# Patient Record
Sex: Female | Born: 1995 | Race: White | Hispanic: No | Marital: Single | Smoking: Never smoker
Health system: Southern US, Community
[De-identification: ages and names within clinical notes are randomized; demographics above are authoritative.]

## PROBLEM LIST (undated history)

## (undated) DIAGNOSIS — Q999 Chromosomal abnormality, unspecified: Secondary | ICD-10-CM

## (undated) DIAGNOSIS — F819 Developmental disorder of scholastic skills, unspecified: Secondary | ICD-10-CM

## (undated) DIAGNOSIS — G40909 Epilepsy, unspecified, not intractable, without status epilepticus: Secondary | ICD-10-CM

## (undated) HISTORY — DX: Chromosomal abnormality, unspecified: Q99.9

---
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
## 2015-01-04 ENCOUNTER — Encounter: Payer: Self-pay | Admitting: Orthopedic Surgery

## 2015-01-04 ENCOUNTER — Ambulatory Visit (INDEPENDENT_AMBULATORY_CARE_PROVIDER_SITE_OTHER): Payer: Medicaid Other | Admitting: Orthopedic Surgery

## 2015-01-04 VITALS — BP 113/72 | Ht 63.0 in | Wt 215.0 lb

## 2015-01-04 DIAGNOSIS — S92301A Fracture of unspecified metatarsal bone(s), right foot, initial encounter for closed fracture: Secondary | ICD-10-CM

## 2015-01-04 NOTE — Progress Notes (Signed)
Patient ID: Penny Thompson, female   DOB: [birth year], 19 y.o.   MRN: [id number]  Chief Complaint  Patient presents with  . Foot Injury    ER follow up on right foot fracture, DOI 12-20-14.    HPI Penny Thompson is a 19 y.o. female.   Presents with a caregiver because she has a history of epilepsy and some other mental deficiencies. She and he gave the history as follows. Approximate 2 weeks ago the patient fell and injured her right foot and ankle. She was taken to the emergency room where x-rays of the foot and ankle show an avulsion fracture of the fifth metatarsal.  Initially she had severe. Pain along the lateral aspect foot and ankle associated with swelling. The pain was constant but has resolved over 2 weeks time it was dull aching and did initially cause a limp which has subsequently resolved after treatment with a Cam Walker   Review of systems neurologically there is no tingling vascular the foot is still warm skin ecchymosis  Review of Systems Review of Systems  Past Medical History  Diagnosis Date  . Epilepsy (HCC)   . Mental developmental delay   . Chromosomal abnormality     No past surgical history on file.  Family History  Problem Relation Age of Onset  . Diabetes Maternal Grandmother   . Diabetes Maternal Grandfather   . Diabetes Paternal Grandmother     Social History Social History  Substance Use Topics  . Smoking status: Never Smoker   . Smokeless tobacco: Not on file  . Alcohol Use: No    No Known Allergies  Current Outpatient Prescriptions  Medication Sig Dispense Refill  . carbamazepine (CARBATROL) 300 MG 12 hr capsule Take 600 mg by mouth 2 (two) times daily.    . cetirizine (ZYRTEC) 10 MG tablet Take 1 tablet (10 mg total) by mouth daily. 30 tablet 11  . Multiple Vitamin (MULTIVITAMIN WITH MINERALS) TABS tablet Take 1 tablet by mouth daily.     No current facility-administered medications for this visit.       Physical Exam Physical  Exam Blood pressure 113/72, height 5\' 3"  (1.6 m), weight 215 lb (97.523 kg). Appearance, there are no abnormalities in terms of appearance the patient was well-developed and well-nourished. The grooming and hygiene were normal.  Mental status orientation, there was normal alertness and orientation Mood pleasant Ambulatory status  No assistive devices were noted but the patient did have a slight limp Examination of the  Right foot Inspection  Tenderness over the fifth metatarsal at the proximal aspect and somewhat over the anterior talofibular ligament. 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 which is flexible but normal ankle range of motion Tests for stability  Anterior drawer test was stable Motor strength   Muscle tone was normal Skin warm dry and intact without laceration or ulceration  He most is in the toes of the fifth fourth and third digits and also in the lateral border of the foot Neurologic examination normal sensation Vascular examination normal pulses with warm extremity and normal capillary refill     Data Reviewed  we independently reviewed and red 2 sets of films 3 views right ankle  : normal appearing ankle with slight tarsometatarsal flattening and prominent head of talus as well as 3 views right foot which show  Tiny avulsion fracture from the proximal aspect of the fifth metatarsal   CLINICAL DATA:  Patient  tripped and fell this morning injuring her right foot.   EXAM: RIGHT FOOT COMPLETE - 3+ VIEW   COMPARISON:  10/06/2013   FINDINGS: There is a small sliver of bone adjacent to the base of the fifth metatarsal, new from the prior exam, consistent with an avulsion fracture at the insertion of the peroneus brevis tendon. There is mild adjacent soft tissue edema.   No other evidence of a fracture. Joints are normally spaced and aligned.   IMPRESSION: Small avulsion fracture at the insertion site of the peroneus brevis tendon  at the base of the right fifth metatarsal.     Electronically Signed   By: Amie Portland M.D.   On: 12/19/2014 16:40   CLINICAL DATA:  Larey Seat this morning. Right ankle pain and bruising. Initial encounter.   EXAM: RIGHT ANKLE - COMPLETE 3+ VIEW   COMPARISON:  None.   FINDINGS: There is no evidence of fracture, dislocation, or joint effusion. There is no evidence of arthropathy or other focal bone abnormality. Soft tissues are unremarkable.   IMPRESSION: Negative.     Electronically Signed   By: Myles Rosenthal M.D.   On: 12/19/2014 16:35  Assessment  Encounter Diagnosis  Name Primary?  Marland Kitchen Avulsion fracture of metatarsal bone of right foot, closed, initial encounter Yes      Plan   our treatment plan is for her to resume normal activities call us if any problems

## 2015-02-21 ENCOUNTER — Emergency Department (HOSPITAL_COMMUNITY)
Admission: EM | Admit: 2015-02-21 | Discharge: 2015-02-21 | Disposition: A | Discharge status: Home / Self Care | Payer: Medicaid Other | Attending: Emergency Medicine | Admitting: Emergency Medicine

## 2015-02-21 ENCOUNTER — Encounter (HOSPITAL_COMMUNITY): Payer: Self-pay | Admitting: Emergency Medicine

## 2015-02-21 DIAGNOSIS — H9201 Otalgia, right ear: Secondary | ICD-10-CM | POA: Insufficient documentation

## 2015-02-21 DIAGNOSIS — H6123 Impacted cerumen, bilateral: Secondary | ICD-10-CM | POA: Insufficient documentation

## 2015-02-21 DIAGNOSIS — G40909 Epilepsy, unspecified, not intractable, without status epilepticus: Secondary | ICD-10-CM | POA: Diagnosis not present

## 2015-02-21 DIAGNOSIS — R509 Fever, unspecified: Secondary | ICD-10-CM | POA: Insufficient documentation

## 2015-02-21 DIAGNOSIS — Z8659 Personal history of other mental and behavioral disorders: Secondary | ICD-10-CM | POA: Insufficient documentation

## 2015-02-21 DIAGNOSIS — Z79899 Other long term (current) drug therapy: Secondary | ICD-10-CM | POA: Diagnosis not present

## 2015-02-21 DIAGNOSIS — J029 Acute pharyngitis, unspecified: Secondary | ICD-10-CM | POA: Diagnosis present

## 2015-02-21 DIAGNOSIS — Q999 Chromosomal abnormality, unspecified: Secondary | ICD-10-CM | POA: Insufficient documentation

## 2015-02-21 DIAGNOSIS — R300 Dysuria: Secondary | ICD-10-CM | POA: Diagnosis not present

## 2015-02-21 LAB — URINE MICROSCOPIC-ADD ON

## 2015-02-21 LAB — URINALYSIS, ROUTINE W REFLEX MICROSCOPIC
GLUCOSE, UA: NEGATIVE mg/dL
HGB URINE DIPSTICK: NEGATIVE
KETONES UR: 15 mg/dL — AB
Nitrite: NEGATIVE
Protein, ur: 30 mg/dL — AB
SPECIFIC GRAVITY, URINE: 1.04 — AB (ref 1.005–1.030)
pH: 5.5 (ref 5.0–8.0)

## 2015-02-21 LAB — POC URINE PREG, ED: Preg Test, Ur: NEGATIVE

## 2015-02-21 LAB — RAPID STREP SCREEN (MED CTR MEBANE ONLY): Streptococcus, Group A Screen (Direct): NEGATIVE

## 2015-02-21 MED ORDER — ACETAMINOPHEN 500 MG PO TABS
1000.0000 mg | ORAL_TABLET | Freq: Once | ORAL | Status: AC
  Administered 2015-02-21: 1000 mg via ORAL
  Filled 2015-02-21: qty 2

## 2015-02-21 MED ORDER — DEXAMETHASONE 4 MG PO TABS
10.0000 mg | ORAL_TABLET | Freq: Once | ORAL | Status: AC
  Administered 2015-02-21: 10 mg via ORAL
  Filled 2015-02-21: qty 3

## 2015-02-21 NOTE — ED Notes (Addendum)
Pt c/o sore throat and fever x 3 days. Pt able to eat and drink. Pt also c/o back pain and mom thinks she may have urinary infection.

## 2015-02-21 NOTE — ED Provider Notes (Signed)
CSN: [id number]     Arrival date & time 02/21/15  1043 History   First MD Initiated Contact with Patient 02/21/15 1137     Chief Complaint  Patient presents with  . Sore Throat  . Fever     (Consider location/radiation/quality/duration/timing/severity/associated sxs/prior Treatment) Patient is a 19 y.o. female presenting with pharyngitis and fever. The history is provided by the patient.  Sore Throat This is a new problem. The current episode started more than 2 days ago. The problem occurs constantly. The problem has not changed since onset.Pertinent negatives include no chest pain, no abdominal pain, no headaches and no shortness of breath. Nothing aggravates the symptoms. Nothing relieves the symptoms. She has tried nothing for the symptoms. The treatment provided no relief.  Fever Associated symptoms: dysuria, ear pain (R ear) and sore throat   Associated symptoms: no chest pain, no chills, no congestion, no headaches, no myalgias, no nausea, no rhinorrhea and no vomiting    19 yo F  With a chief complaint of a sore throat and dysuria. This been going on for the past 3 days. Temperature highest 100 at home.  Patient has been able to eat and drink without difficulty.  Denies flank pain abdominal pain.  Has a sick sibling at home.   Past Medical History  Diagnosis Date  . Epilepsy (HCC)   . Mental developmental delay   . Chromosomal abnormality    History reviewed. No pertinent past surgical history. Family History  Problem Relation Age of Onset  . Diabetes Maternal Grandmother   . Diabetes Maternal Grandfather   . Diabetes Paternal Grandmother    Social History  Substance Use Topics  . Smoking status: Never Smoker   . Smokeless tobacco: None  . Alcohol Use: No   OB History    No data available     Review of Systems  Constitutional: Positive for fever. Negative for chills.  HENT: Positive for ear pain (R ear) and sore throat. Negative for congestion and rhinorrhea.    Eyes: Negative for redness and visual disturbance.  Respiratory: Negative for shortness of breath and wheezing.   Cardiovascular: Negative for chest pain and palpitations.  Gastrointestinal: Negative for nausea, vomiting and abdominal pain.  Genitourinary: Positive for dysuria. Negative for urgency.  Musculoskeletal: Negative for myalgias and arthralgias.  Skin: Negative for pallor and wound.  Neurological: Negative for dizziness and headaches.      Allergies  Review of patient's allergies indicates no known allergies.  Home Medications   Prior to Admission medications   Medication Sig Start Date End Date Taking? Authorizing Provider  carbamazepine (CARBATROL) 300 MG 12 hr capsule Take 600 mg by mouth 2 (two) times daily.   Yes Historical Provider, MD  cetirizine (ZYRTEC) 10 MG tablet Take 1 tablet (10 mg total) by mouth daily. 03/18/14  Yes Arnaldo Natal, MD  Multiple Vitamin (MULTIVITAMIN WITH MINERALS) TABS tablet Take 1 tablet by mouth daily.   Yes Historical Provider, MD  Salicylic Acid 6 % LOTN Apply once daily to arms and legs.As needed. 01/19/12  Yes Historical Provider, MD   BP 118/79 mmHg  Pulse 96  Temp(Src) 97.9 F (36.6 C) (Oral)  Resp 20  Ht 5' 8.5" (1.74 m)  Wt 225 lb (102.059 kg)  BMI 33.71 kg/m2  SpO2 98%  LMP 02/03/2015 Physical Exam  Constitutional: She is oriented to person, place, and time. She appears well-developed and well-nourished. No distress.  HENT:  Head: Normocephalic and atraumatic.   Tonsils are absent  right anterior cervical lymphadenopathy. Bilateral wax impaction unable to visualize TMs.  Handling secretions no uvula deviation  Eyes: EOM are normal. Pupils are equal, round, and reactive to light.  Neck: Normal range of motion. Neck supple.  Cardiovascular: Normal rate and regular rhythm.  Exam reveals no gallop and no friction rub.   No murmur heard. Pulmonary/Chest: Effort normal. She has no wheezes. She has no rales.  Abdominal: Soft.  She exhibits no distension. There is no tenderness. There is no rebound and no guarding.  Musculoskeletal: She exhibits no edema or tenderness.  Neurological: She is alert and oriented to person, place, and time.  Skin: Skin is warm and dry. She is not diaphoretic.  Psychiatric: She has a normal mood and affect. Her behavior is normal.  Nursing note and vitals reviewed.   ED Course  Procedures (including critical care time) Labs Review Labs Reviewed  URINALYSIS, ROUTINE W REFLEX MICROSCOPIC (NOT AT [facility]) - Abnormal; Notable for the following:    Color, Urine ORANGE (*)    APPearance CLOUDY (*)    Specific Gravity, Urine 1.040 (*)    Bilirubin Urine SMALL (*)    Ketones, ur 15 (*)    Protein, ur 30 (*)    Leukocytes, UA TRACE (*)    All other components within normal limits  URINE MICROSCOPIC-ADD ON - Abnormal; Notable for the following:    Squamous Epithelial / LPF 6-30 (*)    Bacteria, UA RARE (*)    All other components within normal limits  RAPID STREP SCREEN (NOT AT [facility])  CULTURE, GROUP A STREP  POC URINE PREG, ED    Imaging Review No results found. I have personally reviewed and evaluated these images and lab results as part of my medical decision-making.   EKG Interpretation None      MDM   Final diagnoses:  Viral pharyngitis  Dysuria    19 yo F  With a chief complaint of a sore throat and dysuria. Will obtain a rapid strep as well as a urine.   rapid strep negative. Urine with trace leukocytes rare bacteria  With 6-30 epis. Will not treat at this time. Patient will go home with Tylenol and Motrin PCP follow-up next week.  1:20 PM:  I have discussed the diagnosis/risks/treatment options with the patient and family and believe the pt to be eligible for discharge home to follow-up with PCP. We also discussed returning to the ED immediately if new or worsening sx occur. We discussed the sx which are most concerning (e.g., sudden worsening pain, fever, inability  to tolerate by mouth) that necessitate immediate return. Medications administered to the patient during their visit and any new prescriptions provided to the patient are listed below.  Medications given during this visit Medications  dexamethasone (DECADRON) tablet 10 mg (not administered)  acetaminophen (TYLENOL) tablet 1,000 mg (1,000 mg Oral Given 02/21/15 1232)    New Prescriptions   No medications on file    The patient appears reasonably screen and/or stabilized for discharge and I doubt any other medical condition or other [facility] requiring further screening, evaluation, or treatment in the ED at this time prior to discharge.    Melene Planan Zeta Bucy, DO 02/21/15 1321

## 2015-02-21 NOTE — ED Notes (Signed)
Pt verbalized understanding of d/c instructionsand follow-up care. No further questions/concerns, VSS, assisted to lobby in wheelchair. 

## 2015-02-21 NOTE — Discharge Instructions (Signed)
Take tylenol 2 pills 4 times a day and ibuprofen 4 pills 3 times a day.  Drink plenty of fluids.  Return for worsening shortness of breath, headache, confusion. Follow up with your family doctor.   Pharyngitis Pharyngitis is a sore throat (pharynx). There is redness, pain, and swelling of your throat. HOME CARE   Drink enough fluids to keep your pee (urine) clear or pale yellow.  Only take medicine as told by your doctor.  You may get sick again if you do not take medicine as told. Finish your medicines, even if you start to feel better.  Do not take aspirin.  Rest.  Rinse your mouth (gargle) with salt water ( tsp of salt per 1 qt of water) every 1-2 hours. This will help the pain.  If you are not at risk for choking, you can suck on hard candy or sore throat lozenges. GET HELP IF:  You have large, tender lumps on your neck.  You have a rash.  You cough up green, yellow-brown, or bloody spit. GET HELP RIGHT AWAY IF:   You have a stiff neck.  You drool or cannot swallow liquids.  You throw up (vomit) or are not able to keep medicine or liquids down.  You have very bad pain that does not go away with medicine.  You have problems breathing (not from a stuffy nose). MAKE SURE YOU:   Understand these instructions.  Will watch your condition.  Will get help right away if you are not doing well or get worse.   This information is not intended to replace advice given to you by your health care provider. Make sure you discuss any questions you have with your health care provider.   Document Released: 08/09/2007 Document Revised: 12/11/2012 Document Reviewed: 10/28/2012 Elsevier Interactive Patient Education [employer]2016 Elsevier Inc.

## 2015-02-22 ENCOUNTER — Encounter: Payer: Self-pay | Admitting: Pediatrics

## 2015-02-22 ENCOUNTER — Ambulatory Visit (INDEPENDENT_AMBULATORY_CARE_PROVIDER_SITE_OTHER): Payer: Medicaid Other | Admitting: Pediatrics

## 2015-02-22 VITALS — Temp 97.8°F | Wt 211.5 lb

## 2015-02-22 DIAGNOSIS — J069 Acute upper respiratory infection, unspecified: Secondary | ICD-10-CM

## 2015-02-22 DIAGNOSIS — Q999 Chromosomal abnormality, unspecified: Secondary | ICD-10-CM

## 2015-02-22 MED ORDER — FLUTICASONE PROPIONATE 50 MCG/ACT NA SUSP: 2.0000 | Freq: Two times a day (BID) | NASAL | Status: DC

## 2015-02-22 NOTE — Progress Notes (Signed)
Chr 19 Delsym Palate Chief Complaint  Patient presents with  . Hospitalization Follow-up    E.R. visit on yesterday dx: virus /// x 4 days cough, fever, sore throat( mucus drain)    HPI Penny P Lopezis here for cough for 3 days. Dad believes she had fever, does not know her temperature.  History was provided by the father. .  ROS:.        Constitutional  Fever?, decreasedl activity.   Opthalmologic  no irritation or drainage.   ENT  Has  rhinorrhea and congestion , no sore throat, no ear pain.   Respiratory  Has  cough ,  No wheeze or chest pain.    Cardiovascular  No chest pain, repaired ASD Gastointestinal  no abdominal pain, nausea or vomiting, bowel movements normal    Genitourinary  Voiding normally   Musculoskeletal  no complaints of pain, no injuries.   Dermatologic  no rashes or lesions Neurologic - has seizure disorder     family history includes Diabetes in her maternal grandfather, maternal grandmother, and paternal grandmother.   Temp(Src) 97.8 F (36.6 C)  Wt 211 lb 8 oz (95.936 kg)  LMP 02/03/2015    Objective:         General alert in NAD- dysmorphic  Derm   no rashes or lesions  Head Normocephalic, atraumatic                    Eyes Normal, no discharge  Ears:   TMs normal bilaterally  Nose:   patent normal mucosa, turbinates normal, no rhinorhea  Oral cavity  moist mucous membranes, no lesions, marked high arched narrow palate  Throat:   normal tonsils, without exudate or erythema  Neck supple FROM  Lymph:   no significant cervical adenopathy  Lungs:  clear with equal breath sounds bilaterally  Heart:   regular rate and rhythm, no murmur  Abdomen:  soft nontender no organomegaly or masses  GU:  deferred  back No deformity  Extremities:   no deformity  Neuro:  intact no focal defects        Assessment/plan  1. Acute upper respiratory infection Cough due to post nasal drip, can try delsym  - fluticasone (FLONASE) 50 MCG/ACT nasal spray;  Place 2 sprays into both nostrils 2 (two) times daily.  Dispense: 16 g; Refill: 2  2. 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

## 2015-02-22 NOTE — Patient Instructions (Signed)
Upper Respiratory Infection, Adult Most upper respiratory infections (URIs) are a viral infection of the air passages leading to the lungs. A URI affects the nose, throat, and upper air passages. The most common type of URI is nasopharyngitis and is typically referred to as "the common cold." URIs run their course and usually go away on their own. Most of the time, a URI does not require medical attention, but sometimes a bacterial infection in the upper airways can follow a viral infection. This is called a secondary infection. Sinus and middle ear infections are common types of secondary upper respiratory infections. Bacterial pneumonia can also complicate a URI. A URI can worsen asthma and chronic obstructive pulmonary disease (COPD). Sometimes, these complications can require emergency medical care and may be life threatening.  CAUSES Almost all URIs are caused by viruses. A virus is a type of germ and can spread from one person to another.  RISKS FACTORS You may be at risk for a URI if:   You smoke.   You have chronic heart or lung disease.  You have a weakened defense (immune) system.   You are very young or very old.   You have nasal allergies or asthma.  You work in crowded or poorly ventilated areas.  You work in health care facilities or schools. SIGNS AND SYMPTOMS  Symptoms typically develop 2-3 days after you come in contact with a cold virus. Most viral URIs last 7-10 days. However, viral URIs from the influenza virus (flu virus) can last 14-18 days and are typically more severe. Symptoms may include:   Runny or stuffy (congested) nose.   Sneezing.   Cough.   Sore throat.   Headache.   Fatigue.   Fever.   Loss of appetite.   Pain in your forehead, behind your eyes, and over your cheekbones (sinus pain).  Muscle aches.  DIAGNOSIS  Your health care provider may diagnose a URI by:  Physical exam.  Tests to check that your symptoms are not due to  another condition such as:  Strep throat.  Sinusitis.  Pneumonia.  Asthma. TREATMENT  A URI goes away on its own with time. It cannot be cured with medicines, but medicines may be prescribed or recommended to relieve symptoms. Medicines may help:  Reduce your fever.  Reduce your cough.  Relieve nasal congestion. HOME CARE INSTRUCTIONS   Take medicines only as directed by your health care provider.   Gargle warm saltwater or take cough drops to comfort your throat as directed by your health care provider.  Use a warm mist humidifier or inhale steam from a shower to increase air moisture. This may make it easier to breathe.  Drink enough fluid to keep your urine clear or pale yellow.   Eat soups and other clear broths and maintain good nutrition.   Rest as needed.   Return to work when your temperature has returned to normal or as your health care provider advises. You may need to stay home longer to avoid infecting others. You can also use a face mask and careful hand washing to prevent spread of the virus.  Increase the usage of your inhaler if you have asthma.   Do not use any tobacco products, including cigarettes, chewing tobacco, or electronic cigarettes. If you need help quitting, ask your health care provider. PREVENTION  The best way to protect yourself from getting a cold is to practice good hygiene.   Avoid oral or hand contact with people with cold   symptoms.   Wash your hands often if contact occurs.  There is no clear evidence that vitamin C, vitamin E, echinacea, or exercise reduces the chance of developing a cold. However, it is always recommended to get plenty of rest, exercise, and practice good nutrition.  SEEK MEDICAL CARE IF:   You are getting worse rather than better.   Your symptoms are not controlled by medicine.   You have chills.  You have worsening shortness of breath.  You have brown or red mucus.  You have yellow or brown nasal  discharge.  You have pain in your face, especially when you bend forward.  You have a fever.  You have swollen neck glands.  You have pain while swallowing.  You have white areas in the back of your throat. SEEK IMMEDIATE MEDICAL CARE IF:   You have severe or persistent:  Headache.  Ear pain.  Sinus pain.  Chest pain.  You have chronic lung disease and any of the following:  Wheezing.  Prolonged cough.  Coughing up blood.  A change in your usual mucus.  You have a stiff neck.  You have changes in your:  Vision.  Hearing.  Thinking.  Mood. MAKE SURE YOU:   Understand these instructions.  Will watch your condition.  Will get help right away if you are not doing well or get worse.   This information is not intended to replace advice given to you by your health care provider. Make sure you discuss any questions you have with your health care provider.   Document Released: 08/16/2000 Document Revised: 07/07/2014 Document Reviewed: 05/28/2013 Elsevier Interactive Patient Education 2016 [employer]  

## 2015-02-23 LAB — CULTURE, GROUP A STREP: Strep A Culture: NEGATIVE

## 2015-04-05 ENCOUNTER — Other Ambulatory Visit: Payer: Self-pay | Admitting: Pediatrics

## 2015-04-05 DIAGNOSIS — J302 Other seasonal allergic rhinitis: Secondary | ICD-10-CM

## 2015-04-05 MED ORDER — CETIRIZINE HCL 10 MG PO TABS: 10.0000 mg | ORAL_TABLET | Freq: Every day | ORAL | Status: AC

## 2015-09-02 ENCOUNTER — Encounter: Payer: Self-pay | Admitting: Pediatrics

## 2016-03-31 DIAGNOSIS — Z029 Encounter for administrative examinations, unspecified: Secondary | ICD-10-CM

## 2016-09-27 IMAGING — DX DG FOOT COMPLETE 3+V*R*
3 series · 3 of 3 positions shown · non-contrast
Comparison: 10/06/2013

CLINICAL DATA: Patient tripped and fell this morning injuring her
right foot.

EXAM:
RIGHT FOOT COMPLETE - 3+ VIEW

[foot ap]
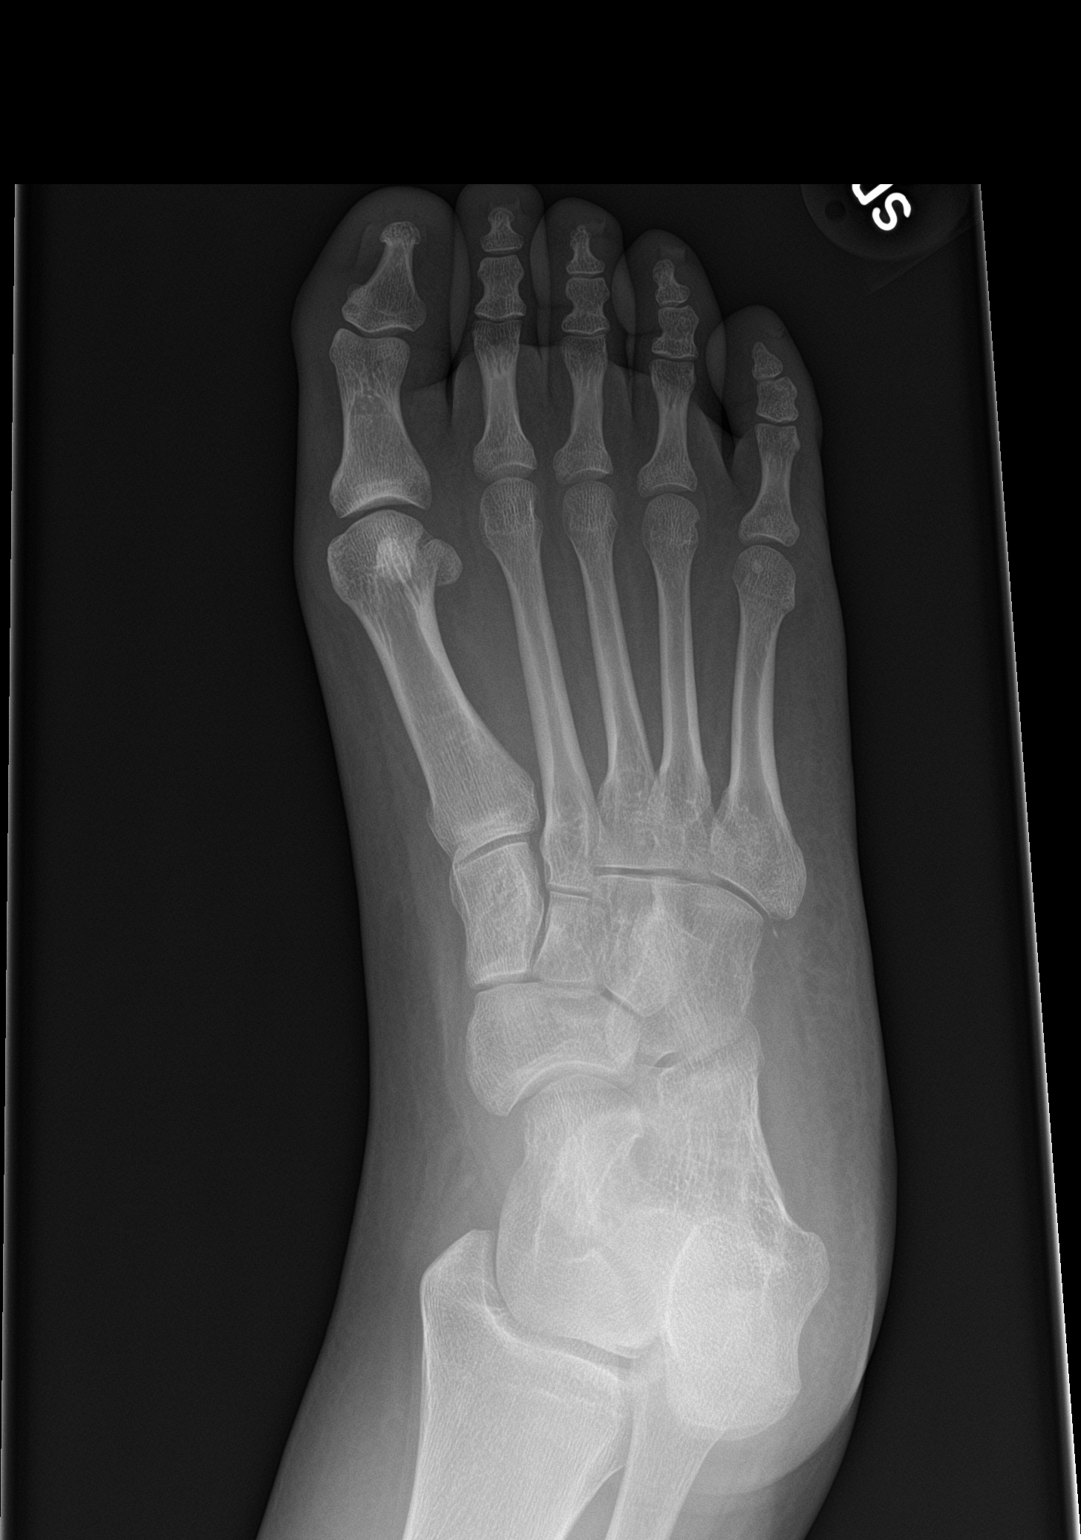

[foot obl]
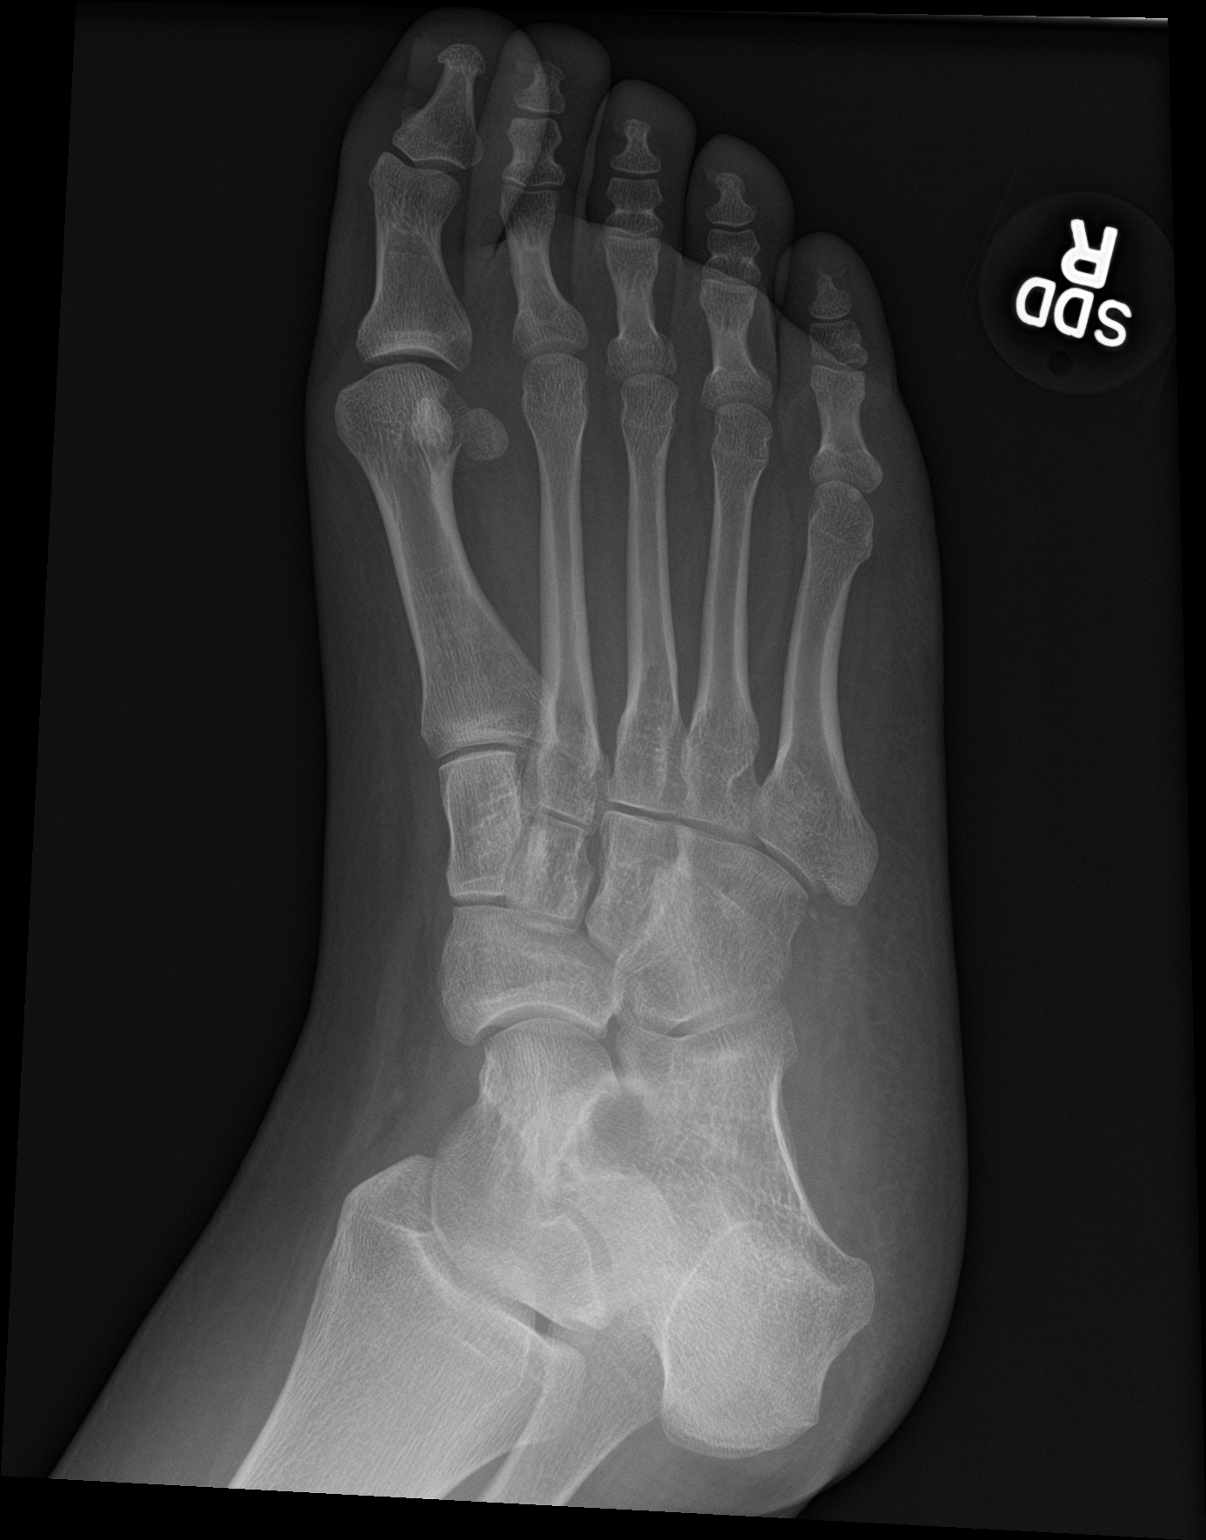

[foot lat]
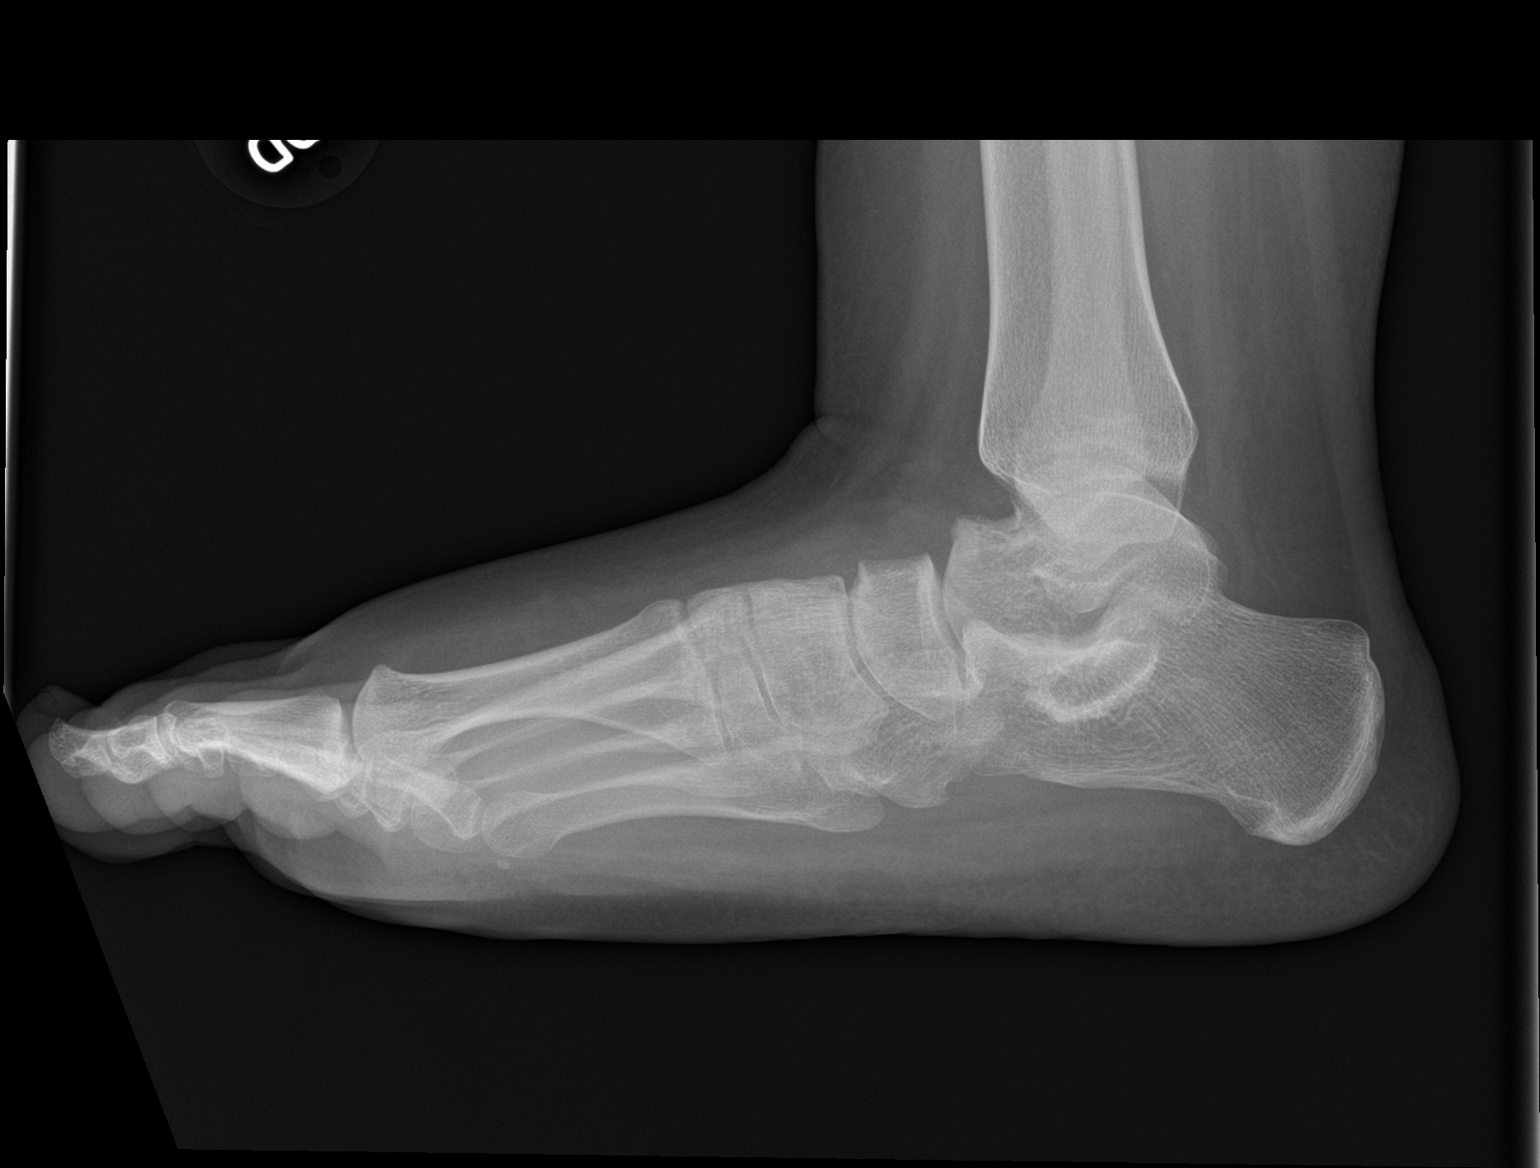

[3 of 3 positions shown; findings below may reference images not displayed]

FINDINGS: There is a small sliver of bone adjacent to the base of the fifth
metatarsal, new from the prior exam, consistent with an avulsion
fracture at the insertion of the peroneus brevis tendon. There is
mild adjacent soft tissue edema.

No other evidence of a fracture. Joints are normally spaced and
aligned.
IMPRESSION: Small avulsion fracture at the insertion site of the peroneus brevis
tendon at the base of the right fifth metatarsal.

## 2016-09-27 IMAGING — DX DG ANKLE COMPLETE 3+V*R*
3 series · 3 of 3 positions shown · non-contrast
Comparison: None.

CLINICAL DATA: Fell this morning. Right ankle pain and bruising.
Initial encounter.

EXAM:
RIGHT ANKLE - COMPLETE 3+ VIEW

[ankle ap]
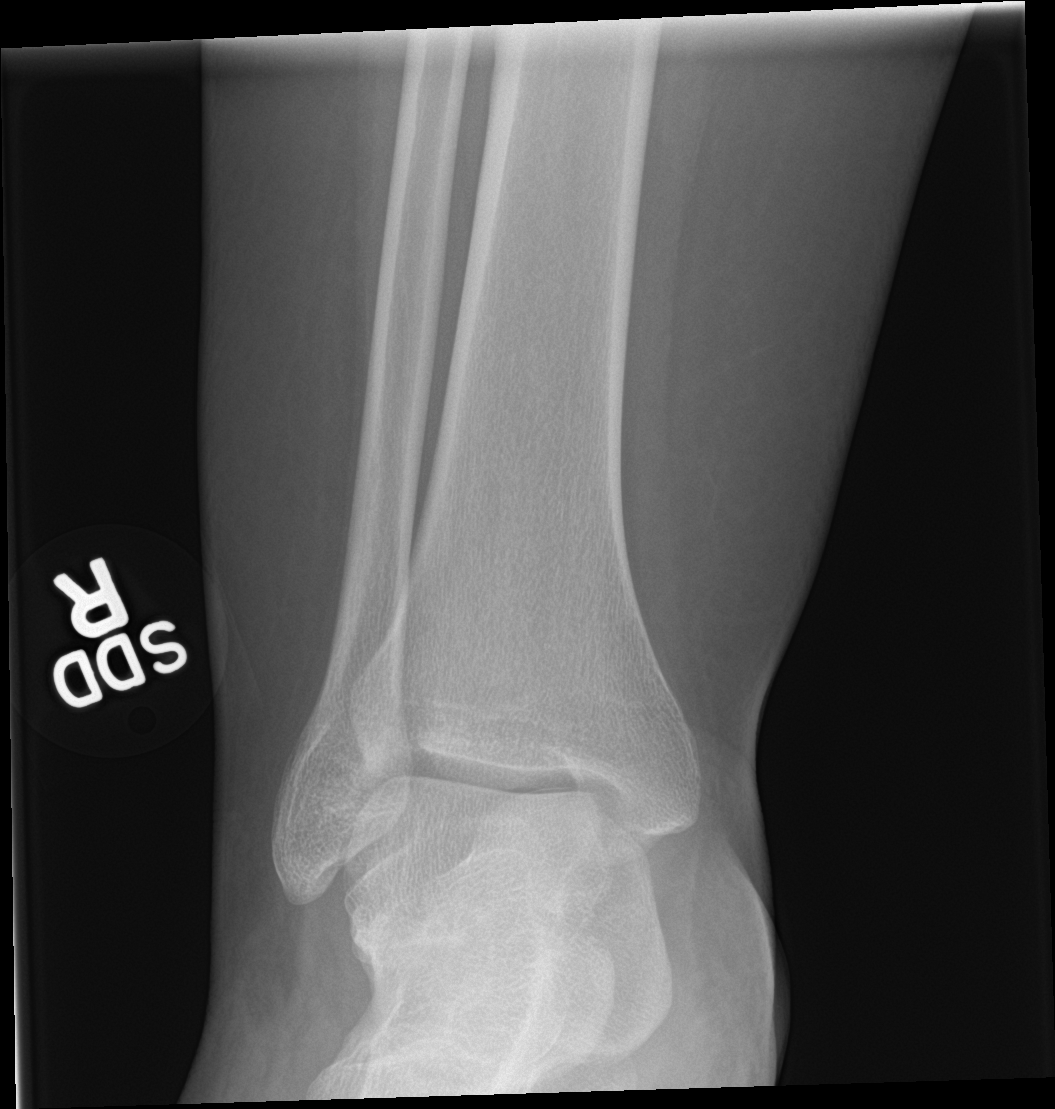

[ankle obl]
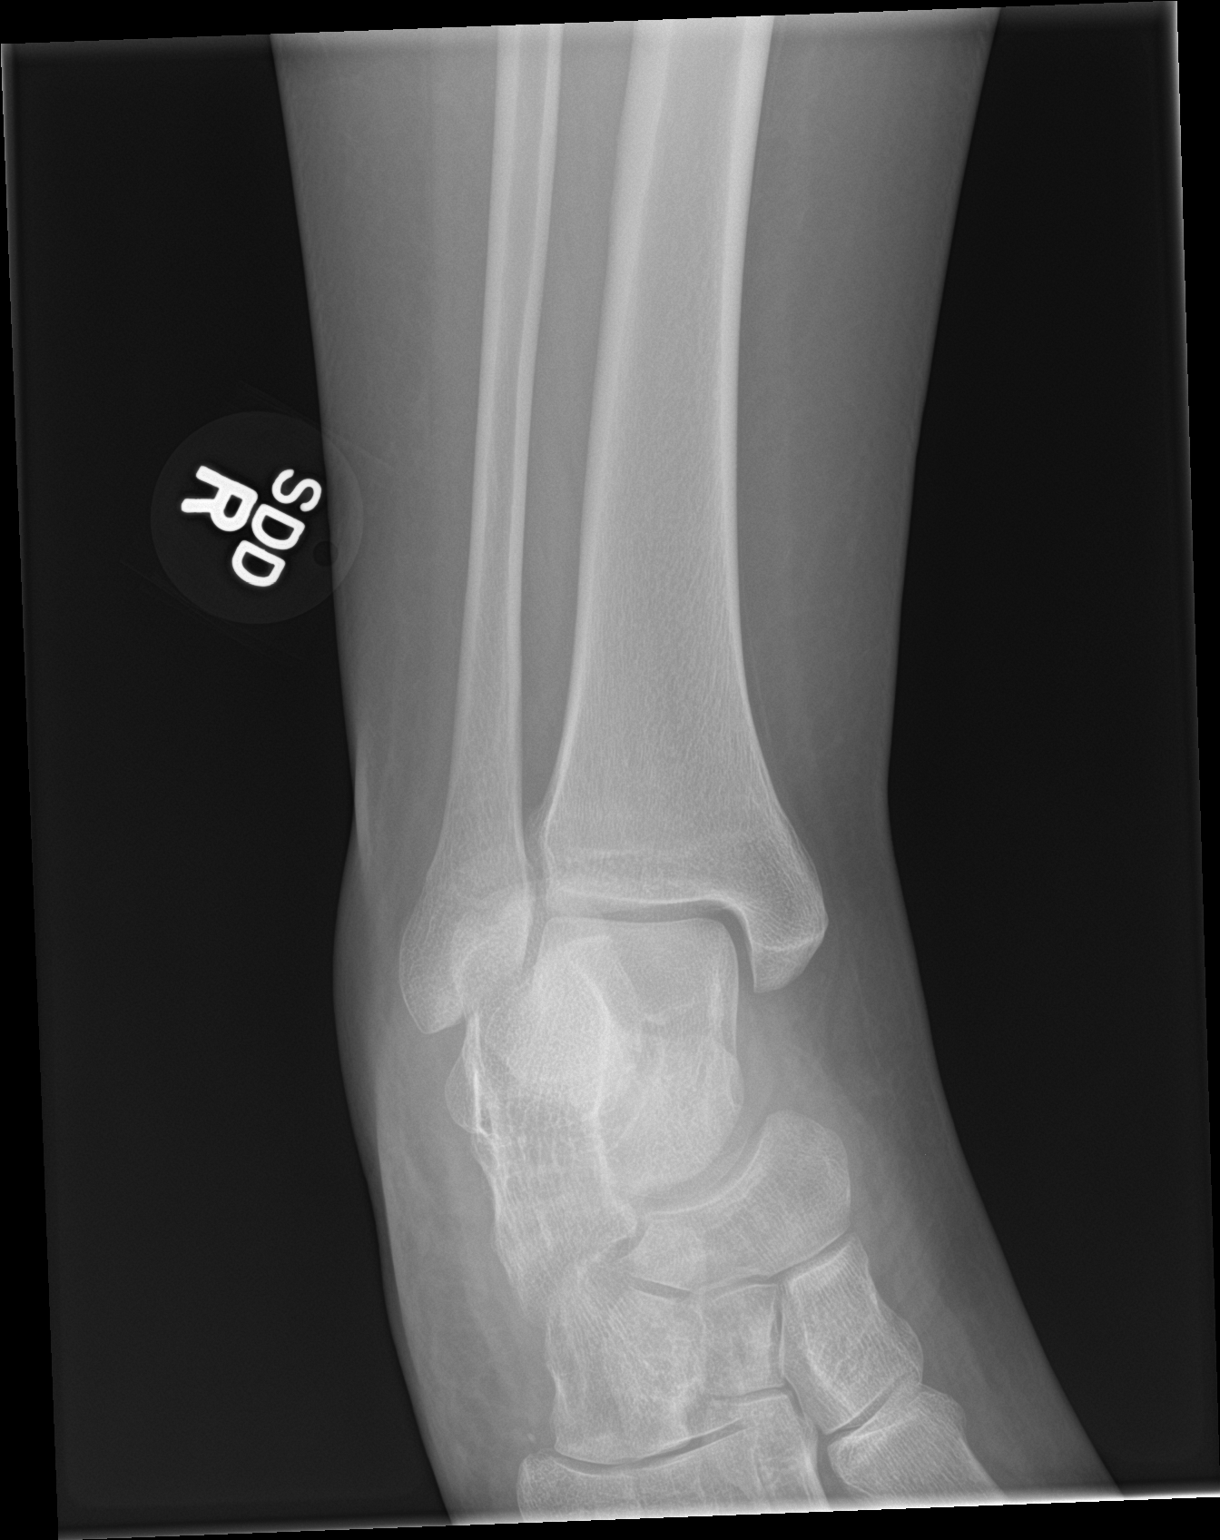

[ankle lat]
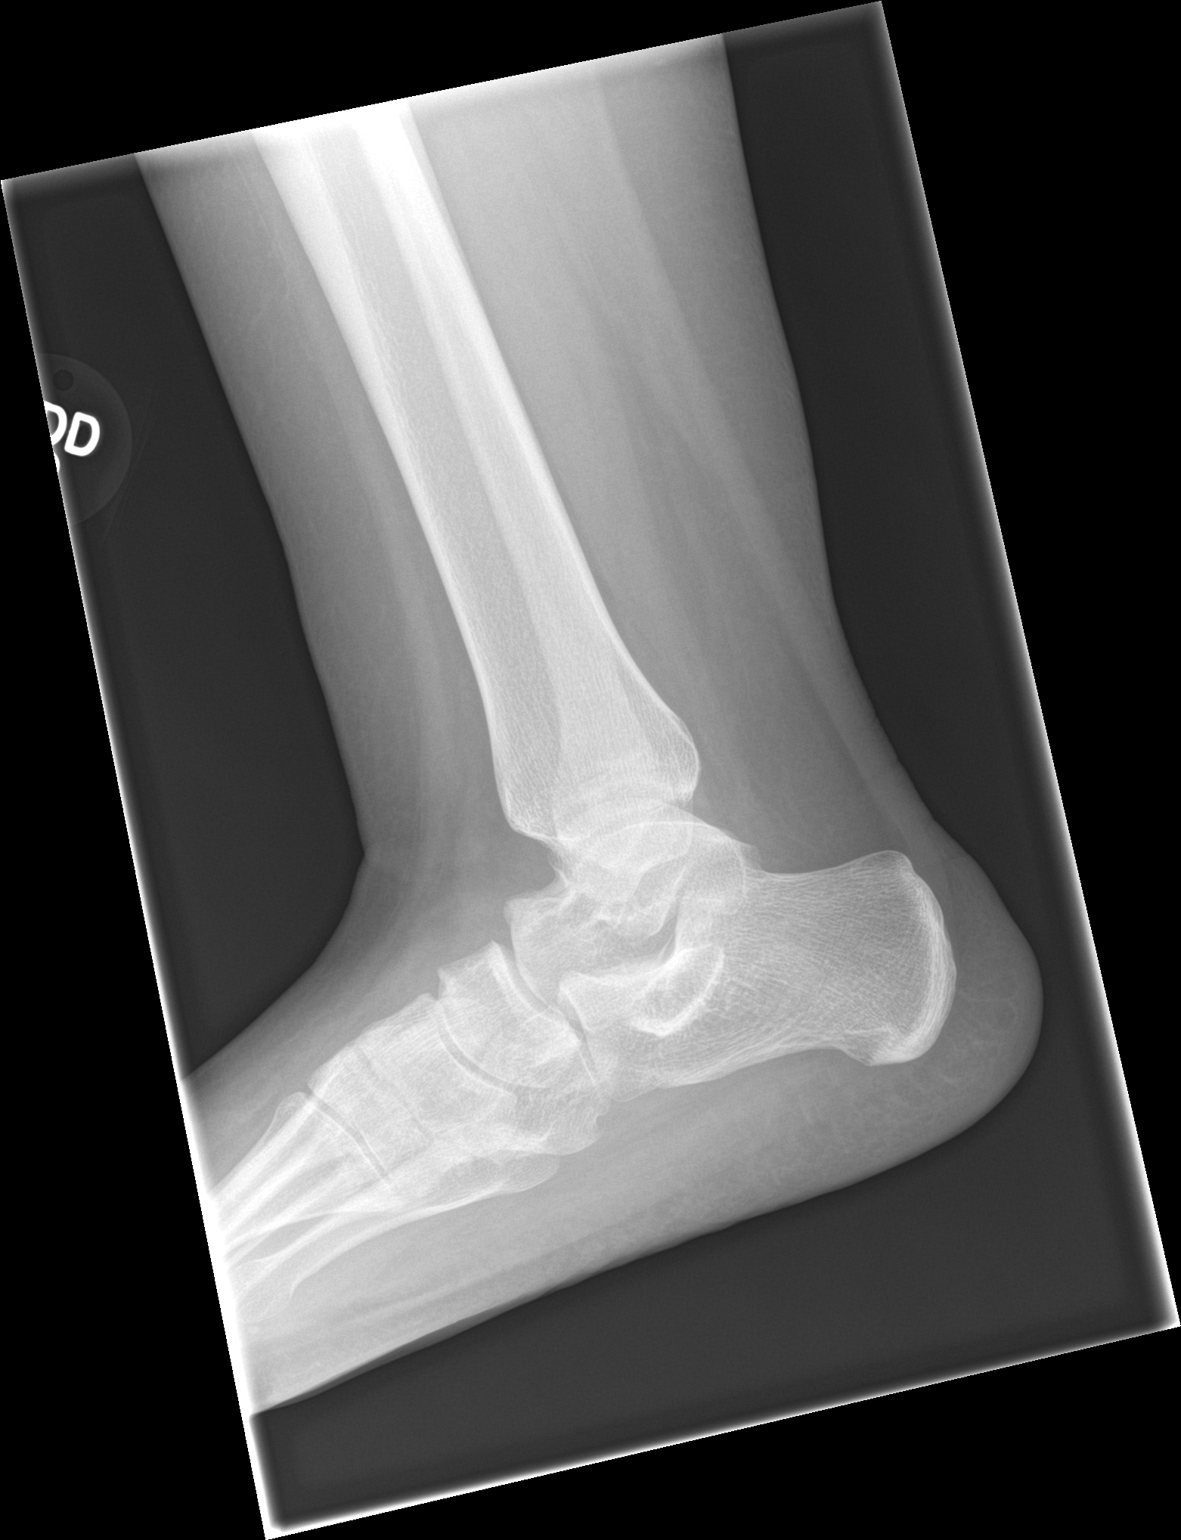

[3 of 3 positions shown; findings below may reference images not displayed]

FINDINGS: There is no evidence of fracture, dislocation, or joint effusion.
There is no evidence of arthropathy or other focal bone abnormality.
Soft tissues are unremarkable.
IMPRESSION: Negative.

## 2017-04-08 ENCOUNTER — Encounter (HOSPITAL_COMMUNITY): Payer: Self-pay | Admitting: Emergency Medicine

## 2017-04-08 ENCOUNTER — Emergency Department (HOSPITAL_COMMUNITY)
Admission: EM | Admit: 2017-04-08 | Discharge: 2017-04-08 | Disposition: A | Discharge status: Home / Self Care | Payer: Medicaid Other | Attending: Emergency Medicine | Admitting: Emergency Medicine

## 2017-04-08 ENCOUNTER — Emergency Department (HOSPITAL_COMMUNITY): Payer: Medicaid Other

## 2017-04-08 ENCOUNTER — Other Ambulatory Visit: Payer: Self-pay

## 2017-04-08 DIAGNOSIS — M791 Myalgia, unspecified site: Secondary | ICD-10-CM | POA: Insufficient documentation

## 2017-04-08 DIAGNOSIS — F819 Developmental disorder of scholastic skills, unspecified: Secondary | ICD-10-CM | POA: Insufficient documentation

## 2017-04-08 DIAGNOSIS — Z79899 Other long term (current) drug therapy: Secondary | ICD-10-CM | POA: Insufficient documentation

## 2017-04-08 DIAGNOSIS — R509 Fever, unspecified: Secondary | ICD-10-CM | POA: Diagnosis present

## 2017-04-08 DIAGNOSIS — G8918 Other acute postprocedural pain: Secondary | ICD-10-CM

## 2017-04-08 LAB — URINALYSIS, ROUTINE W REFLEX MICROSCOPIC
Bilirubin Urine: NEGATIVE
Glucose, UA: NEGATIVE mg/dL
Ketones, ur: 80 mg/dL — AB
Leukocytes, UA: NEGATIVE
NITRITE: NEGATIVE
Protein, ur: 100 mg/dL — AB
SPECIFIC GRAVITY, URINE: 1.021 (ref 1.005–1.030)
pH: 5 (ref 5.0–8.0)

## 2017-04-08 LAB — RAPID STREP SCREEN (MED CTR MEBANE ONLY): STREPTOCOCCUS, GROUP A SCREEN (DIRECT): NEGATIVE

## 2017-04-08 LAB — INFLUENZA PANEL BY PCR (TYPE A & B)
INFLBPCR: NEGATIVE
Influenza A By PCR: NEGATIVE

## 2017-04-08 MED ORDER — CLINDAMYCIN HCL 300 MG PO CAPS: 300.0000 mg | ORAL_CAPSULE | Freq: Four times a day (QID) | ORAL | 0 refills | Status: DC

## 2017-04-08 NOTE — ED Triage Notes (Signed)
Pt to ED with mother-- pt has mental disability -- has to have mother at bedside. Pt has been c/o back pain, headache, cough, sore throat started Friday night-- had oral surgery for molars on Tuesday-- has not had any difficulties with that.   Mother requests that pt not have labs drawn unless absolutely necessary due to pt's hx and difficult lab stick---

## 2017-04-08 NOTE — ED Notes (Signed)
Mother states concerned because patient at times is moaning and crying and upset which is not like her.  Recently has dental work completed 5 days ago, 4 teeth removed and deep dental cleaning.

## 2017-04-08 NOTE — ED Provider Notes (Signed)
[facility] Provider Note   CSN: [id number] Arrival date & time: 04/08/17  1050     History   Chief Complaint Chief Complaint  Patient presents with  . Fever  . Back Pain  . Headache  . flu like symptoms    HPI [facility] is a 22 y.o. female.  HPI Patient with developmental delay.  Unable to give history.  Level 5 caveat.  Mother's at bedside.  Mother states the patient had some dental extraction 5 days ago.  She has been on amoxicillin since that time.  2 days ago she began complaining of low back pain and had decreased energy.  Mother states patient has been limited running low-grade fever and started complaining of pain in her throat.  She has had a mild cough but no sputum production.  No vomiting or diarrhea. Past Medical History:  Diagnosis Date  . Chromosomal abnormality   . Epilepsy (HCC)   . Mental developmental delay     Patient Active Problem List   Diagnosis Date Noted  . Otitis externa, chronic 02/13/2013  . Recurrent suppurative otitis media of right ear 02/13/2013  . UTI (urinary tract infection) 02/13/2013  . Developmental delay 02/05/2013  . Partial epilepsy (HCC) 08/06/2012  . 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 Ostium secundum type atrial septal defect 07/09/2012  . 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 No pertinent surgical history.  OB History    No data available       Home Medications    Prior to Admission medications   Medication Sig Start Date End Date Taking? Authorizing Provider  carbamazepine (CARBATROL) 300 MG 12 hr capsule Take 600 mg by mouth 2 (two) times daily.    [provider]  cetirizine (ZYRTEC) 10 MG tablet Take 1 tablet (10 mg total) by mouth daily. 04/05/15   McDonell, Alfredia Client, MD  clindamycin (CLEOCIN) 300 MG capsule Take 1 capsule (300 mg total) by mouth 4  (four) times daily. X 7 days 04/08/17   Loren Racer, MD  fluticasone [facility]) 50 MCG/ACT nasal spray Place 2 sprays into both nostrils 2 (two) times daily. 02/22/15   McDonell, Alfredia Client, MD  Multiple Vitamin (MULTIVITAMIN WITH MINERALS) TABS tablet Take 1 tablet by mouth daily.    [provider]  Salicylic Acid 6 % LOTN Apply once daily to arms and legs.As needed. 01/19/12   [provider]    Family History Family History  Problem Relation Age of Onset  . Diabetes Maternal Grandmother   . Diabetes Maternal Grandfather   . Diabetes Paternal Grandmother     Social History Social History   Tobacco Use  . Smoking status: Never Smoker  Substance Use Topics  . Alcohol use: No  . Drug use: No     Allergies   Patient has no known allergies.   Review of Systems Review of Systems  Unable to perform ROS: Patient nonverbal     Physical Exam Updated Vital Signs BP 113/73   Pulse (!) 128   Temp 97.8 F (36.6 C) (Axillary)   Resp 20   Ht 5' 7.5" (1.715 m)   Wt 105.2 kg (232 lb)   LMP 03/25/2017 (Approximate)   SpO2 99%   BMI 35.80 kg/m   Physical Exam  Constitutional: She appears well-developed and well-nourished.  Abnormal face is consistent with chromosomal abnormality  HENT:  Head: Normocephalic  and atraumatic.  Mouth/Throat: Oropharynx is clear and moist.  Difficult to examine oropharynx due to patient noncompliance.  Does appear to have erythematous oropharynx but no exudates appreciated.  Eyes: EOM are normal. Pupils are equal, round, and reactive to light.  Neck: Normal range of motion. Neck supple.  No meningismus  Cardiovascular: Regular rhythm.  Tachycardia  Pulmonary/Chest: Effort normal and breath sounds normal.  Abdominal: Soft. Bowel sounds are normal. There is no tenderness. There is no rebound and no guarding.  Musculoskeletal: Normal range of motion. She exhibits no edema or tenderness.  Lymphadenopathy:    She has cervical  adenopathy.  Neurological: She is alert.  Intermittently following simple commands.  Moving all extremities without focal deficit.  Skin: Skin is warm and dry. No rash noted. There is erythema.  Skin appears to be flushed  Psychiatric:  Developmental delay.  Fear of strangers.  Nursing note and vitals reviewed.    ED Treatments / Results  Labs (all labs ordered are listed, but only abnormal results are displayed) Labs Reviewed  URINALYSIS, ROUTINE W REFLEX MICROSCOPIC - Abnormal; Notable for the following components:      Result Value   APPearance HAZY (*)    Hgb urine dipstick SMALL (*)    Ketones, ur 80 (*)    Protein, ur 100 (*)    Bacteria, UA RARE (*)    Squamous Epithelial / LPF 6-30 (*)    All other components within normal limits  RAPID STREP SCREEN (NOT AT [facility])  CULTURE, GROUP A STREP [facility]  INFLUENZA PANEL BY PCR (TYPE A & B)    EKG  EKG Interpretation None       Radiology Dg Chest 2 View  Result Date: 04/08/2017 CLINICAL DATA:  Patient is crying and upset. EXAM: CHEST  2 VIEW COMPARISON:  06/13/2012 FINDINGS: Stable postsurgical changes. Cardiomediastinal silhouette is normal. Mediastinal contours appear intact. There is no evidence of focal airspace consolidation, pleural effusion or pneumothorax. Osseous structures are without acute abnormality. Soft tissues are grossly normal. IMPRESSION: No active cardiopulmonary disease. Electronically Signed   By: Ted Mcalpineobrinka  Dimitrova M.D.   On: 04/08/2017 14:39    Procedures Procedures (including critical care time)  Medications Ordered in ED Medications - No data to display   Initial Impression / Assessment and Plan / ED Course  I have reviewed the triage vital signs and the nursing notes.  Pertinent labs & imaging results that were available during my care of the patient were reviewed by me and considered in my medical decision making (see chart for details).    Limited exam.  Chest x-ray, UA, strep, flu test  negative.  Patient is well-appearing.  She is able to drink without difficulty.  Heart rate is improved when patient is not agitated.  Possible dental infection status post teeth extraction.  Will broaden antibiotic coverage.  Low suspicion for deep space infection.  Patient to follow-up closely with her dentist.  Strict return precautions have been given.   Final Clinical Impressions(s) / ED Diagnoses   Final diagnoses:  Pain following oral surgery  Myalgia    ED Discharge Orders        Ordered    clindamycin (CLEOCIN) 300 MG capsule  4 times daily     04/08/17 1734       Loren RacerYelverton, Vesna Kable, MD 04/08/17 1736

## 2017-04-08 NOTE — Discharge Instructions (Signed)
Call your oral surgeon to arrange close follow-up.  Return immediately for any worsening symptoms or concerns.

## 2017-04-10 LAB — CULTURE, GROUP A STREP (THRC)

## 2018-01-02 ENCOUNTER — Encounter: Payer: Self-pay | Admitting: Pediatrics

## 2018-03-04 ENCOUNTER — Encounter (HOSPITAL_COMMUNITY): Payer: Self-pay | Admitting: Emergency Medicine

## 2018-03-04 ENCOUNTER — Emergency Department (HOSPITAL_COMMUNITY): Payer: Medicaid Other

## 2018-03-04 ENCOUNTER — Emergency Department (HOSPITAL_COMMUNITY)
Admission: EM | Admit: 2018-03-04 | Discharge: 2018-03-04 | Disposition: A | Discharge status: Home / Self Care | Payer: Medicaid Other | Attending: Emergency Medicine | Admitting: Emergency Medicine

## 2018-03-04 ENCOUNTER — Other Ambulatory Visit: Payer: Self-pay

## 2018-03-04 DIAGNOSIS — Z79899 Other long term (current) drug therapy: Secondary | ICD-10-CM | POA: Insufficient documentation

## 2018-03-04 DIAGNOSIS — R05 Cough: Secondary | ICD-10-CM | POA: Diagnosis present

## 2018-03-04 DIAGNOSIS — J181 Lobar pneumonia, unspecified organism: Secondary | ICD-10-CM | POA: Diagnosis not present

## 2018-03-04 DIAGNOSIS — J189 Pneumonia, unspecified organism: Secondary | ICD-10-CM

## 2018-03-04 LAB — INFLUENZA PANEL BY PCR (TYPE A & B)
Influenza A By PCR: NEGATIVE
Influenza B By PCR: POSITIVE — AB

## 2018-03-04 MED ORDER — OSELTAMIVIR PHOSPHATE 75 MG PO CAPS: 75.0000 mg | ORAL_CAPSULE | Freq: Two times a day (BID) | ORAL | 0 refills | Status: DC

## 2018-03-04 MED ORDER — ACETAMINOPHEN 325 MG PO TABS
650.0000 mg | ORAL_TABLET | Freq: Once | ORAL | Status: AC
  Administered 2018-03-04: 650 mg via ORAL
  Filled 2018-03-04: qty 2

## 2018-03-04 MED ORDER — LEVOFLOXACIN 750 MG PO TABS: 750.0000 mg | ORAL_TABLET | Freq: Every day | ORAL | 0 refills | Status: DC

## 2018-03-04 NOTE — ED Provider Notes (Addendum)
[facility] EMERGENCY [facility]PARTMENT Provider Note   CSN: [id number] Arrival date & time: 03/04/18  0759   History   Chief Complaint Chief Complaint  Patient presents with  . Cough    HPI [facility] is a 22 y.o. female.   HPI patient is accompanied by her sister who provides additional information   [age] YOF presents today with complaints of cough.  Patient notes over the last 3 weeks she has had upper respiratory congestion rhinorrhea and cough.  She notes pain in her chest with coughing.  She denies any smoking or history of asthma.  The patient's sister notes she was seen 2 weeks ago and started on Augmentin.  She was again seen last week and started on azithromycin. .  She notes some minor shortness of breath and fever yesterday.   Past Medical History:  Diagnosis Date  . Chromosomal abnormality   . Epilepsy (HCC)   . Mental developmental delay     Patient Active Problem List   Diagnosis Date Noted  . Otitis externa, chronic 02/13/2013  . Recurrent suppurative otitis media of right ear 02/13/2013  . UTI (urinary tract infection) 02/13/2013  . Developmental delay 02/05/2013  . Partial epilepsy (HCC) 08/06/2012  . 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 Ostium secundum type atrial septal defect 07/09/2012  . 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 No pertinent surgical history.   OB History   No obstetric history on file.      Home Medications    Prior to Admission medications   Medication Sig Start Date End Date Taking? Authorizing Provider  cetirizine (ZYRTEC) 10 MG tablet Take 1 tablet (10 mg total) by mouth daily. Patient not taking: Reported on 03/04/2018 04/05/15   McDonell, Alfredia ClientMary Jo, MD  clindamycin (CLEOCIN) 300 MG capsule Take 1 capsule (300 mg total) by mouth 4 (four) times daily. X 7 days Patient not taking: Reported on  03/04/2018 04/08/17   Loren RacerYelverton, David, MD  fluticasone [facility](FLONASE) 50 MCG/ACT nasal spray Place 2 sprays into both nostrils 2 (two) times daily. Patient not taking: Reported on 03/04/2018 02/22/15   McDonell, Alfredia ClientMary Jo, MD  levofloxacin (LEVAQUIN) 750 MG tablet Take 1 tablet (750 mg total) by mouth daily. 03/04/18   Joangel Vanosdol, Tinnie GensJeffrey, PA-C  oseltamivir (TAMIFLU) 75 MG capsule Take 1 capsule (75 mg total) by mouth every 12 (twelve) hours. 03/04/18   Eyvonne MechanicHedges, Jolan Upchurch, PA-C    Family History Family History  Problem Relation Age of Onset  . Diabetes Maternal Grandmother   . Diabetes Maternal Grandfather   . Diabetes Paternal Grandmother     Social History Social History   Tobacco Use  . Smoking status: Never Smoker  . Smokeless tobacco: Never Used  Substance Use Topics  . Alcohol use: No  . Drug use: No     Allergies   Patient has no known allergies.   Review of Systems Review of Systems  All other systems reviewed and are negative.   Physical Exam Updated Vital Signs BP 120/74 (BP Location: Right Arm)   Pulse (!) 115   Temp 98.3 F (36.8 C) (Axillary)   Resp 18   LMP 02/25/2018   SpO2 100%   Physical Exam Vitals signs and nursing note reviewed.  Constitutional:      Appearance: She is well-developed.  HENT:     Head: Normocephalic and atraumatic.     Comments: Oropharynx clear with  no erythema edema or exudate    Nose:     Comments: Right TM normal, left cerumen impaction Eyes:     General: No scleral icterus.       Right eye: No discharge.        Left eye: No discharge.     Conjunctiva/sclera: Conjunctivae normal.     Pupils: Pupils are equal, round, and reactive to light.  Neck:     Musculoskeletal: Normal range of motion.     Vascular: No JVD.     Trachea: No tracheal deviation.  Pulmonary:     Effort: Pulmonary effort is normal.     Breath sounds: No stridor.     Comments: Course expiratory lung sounds, no wheeze or respiratory distress Neurological:      Mental Status: She is alert and oriented to person, place, and time.     Coordination: Coordination normal.  Psychiatric:        Behavior: Behavior normal.        Thought Content: Thought content normal.        Judgment: Judgment normal.      ED Treatments / Results  Labs (all labs ordered are listed, but only abnormal results are displayed) Labs Reviewed  INFLUENZA PANEL BY PCR (TYPE A & B) - Abnormal; Notable for the following components:      Result Value   Influenza B By PCR POSITIVE (*)    All other components within normal limits    EKG None  Radiology Dg Chest 2 View  Result Date: 03/04/2018 CLINICAL DATA:  Cough for 3 weeks. EXAM: CHEST - 2 VIEW COMPARISON:  PA and lateral chest 04/08/2017. FINDINGS: There is focal airspace disease in the right middle lobe consistent with pneumonia. The left lung is clear. Heart size is normal. No pneumothorax or pleural effusion. IMPRESSION: Right middle lobe pneumonia. Electronically Signed   By: Drusilla Kannerhomas  Dalessio M.D.   On: 03/04/2018 09:09    Procedures Procedures (including critical care time)  Medications Ordered in ED Medications  acetaminophen (TYLENOL) tablet 650 mg (650 mg Oral Given 03/04/18 1105)     Initial Impression / Assessment and Plan / ED Course  I have reviewed the triage vital signs and the nursing notes.  Pertinent labs & imaging results that were available during my care of the patient were reviewed by me and considered in my medical decision making (see chart for details).     Labs:   Imaging: DG chest 2 view  Consults:  Treatments: Tylenol  Discharge Meds: Levaquin, Tamiflu  Assessment/Plan: 22 year old female presents today with community-acquired pneumonia.  Patient is accompanied by her sister and her mother.  She has developmental delay and only provides small bits of information.  She does not appear to be in any acute distress at the time of my evaluation.  She is slightly tachycardic but  afebrile.  She has been on Augmentin and azithromycin although after speaking with her mother further the discontinued Augmentin therapy after 5 days and switched her to the Zithromax and.  She notes that her symptoms have not been worsening they just have not been improving.    Patient is remained stable throughout her stay here.  I personally ambulated her with pulse O2 showing readings in the 99 100 range with no dyspnea or signs of struggle.   Patient was discharged pending results.  Her results came back showing influenza.  I do find this reasonable to continue antibiotic therapy with Levaquin and Tamiflu.  Her mother was informed of these results, again she will return her immediately if her symptoms worsen or do not improve in the next 36 hours.  Final Clinical Impressions(s) / ED Diagnoses   Final diagnoses:  Community acquired pneumonia of right middle lobe of lung [facility])    ED Discharge Orders         Ordered    levofloxacin (LEVAQUIN) 750 MG tablet  Daily     03/04/18 1135    oseltamivir (TAMIFLU) 75 MG capsule  Every 12 hours     03/04/18 1135           Eyvonne Mechanic, PA-C 03/04/18 1606    Tegeler, Canary Brim, MD 03/04/18 1817    Eyvonne Mechanic, PA-C 03/05/18 0911    Tegeler, Canary Brim, MD 03/05/18 [phone]

## 2018-03-04 NOTE — Discharge Instructions (Addendum)
Please read attached information.  Please return immediately if you develop any new or worsening signs or symptoms.  Please return if symptoms do not improve in 36 hours.

## 2018-03-04 NOTE — ED Triage Notes (Signed)
Sister stated, shes had a cough for 3 weeks. She is special needs.

## 2019-01-16 IMAGING — CR DG CHEST 2V
2 series · 2 of 2 positions shown · non-contrast
Comparison: 06/13/2012

CLINICAL DATA: Patient is crying and upset.

EXAM:
CHEST  2 VIEW

[chest ap]
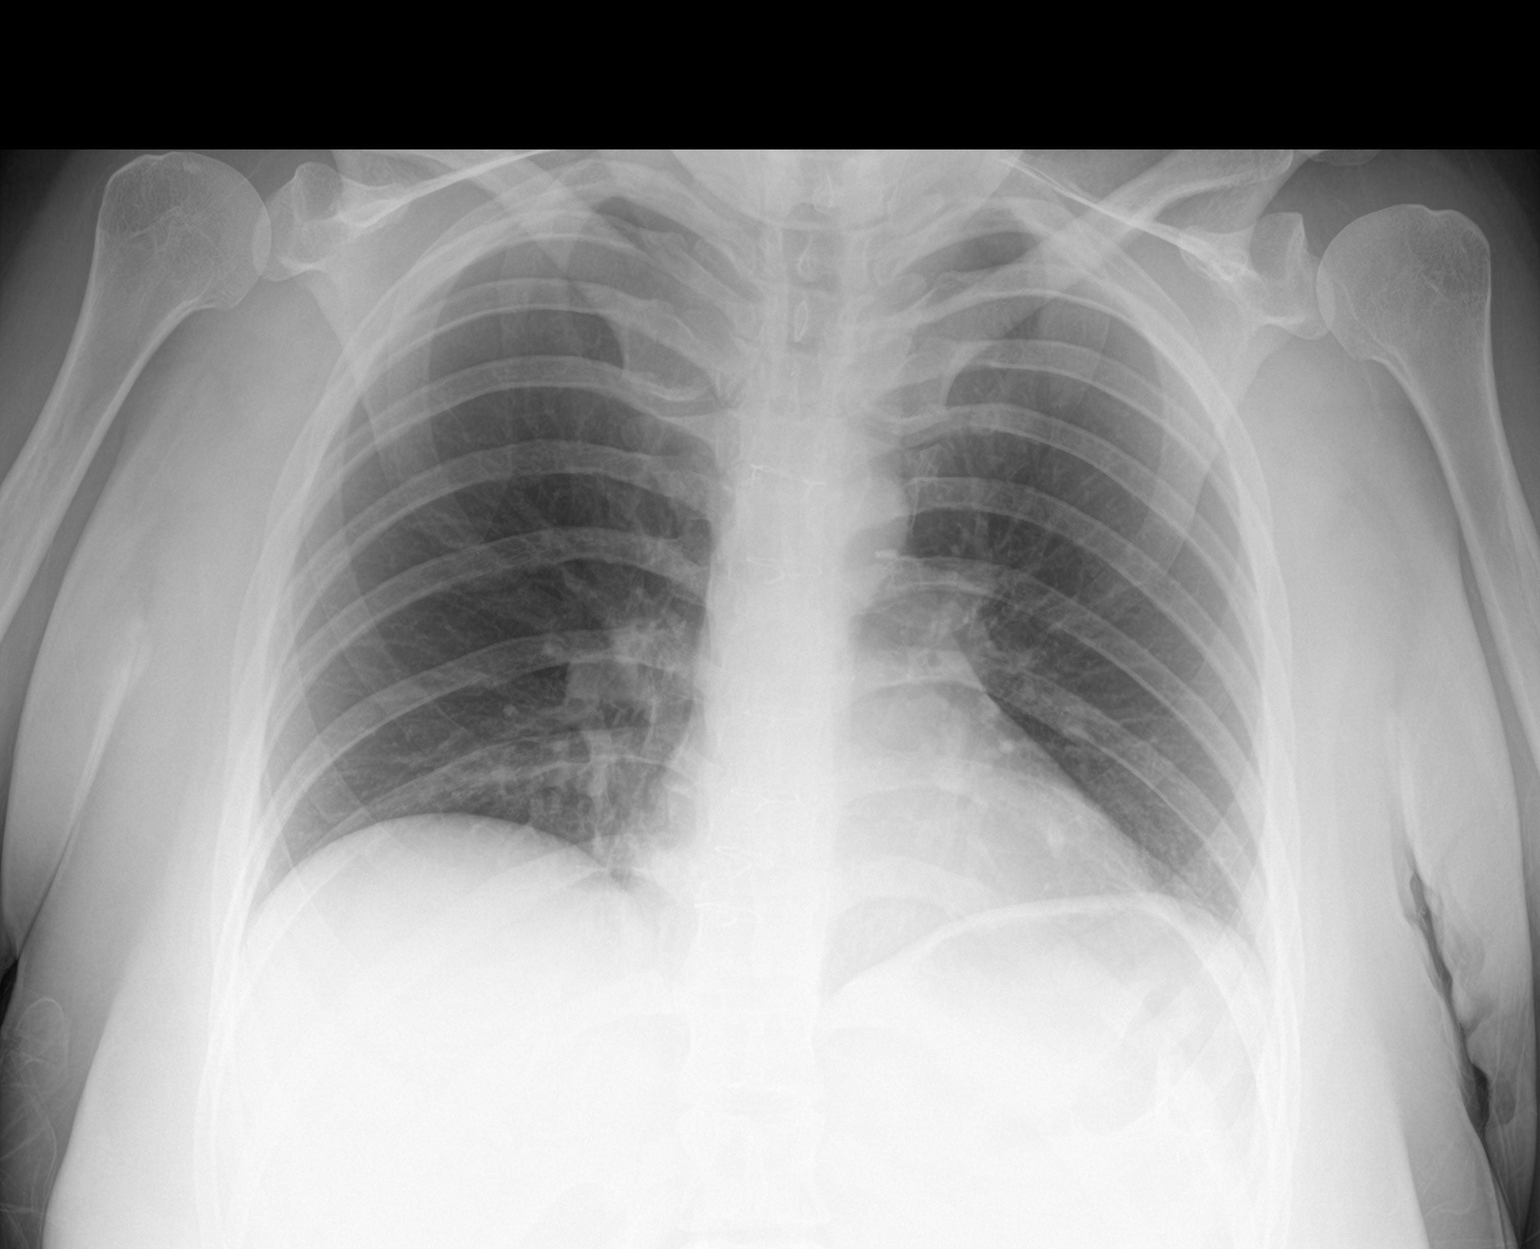

[chest lat]
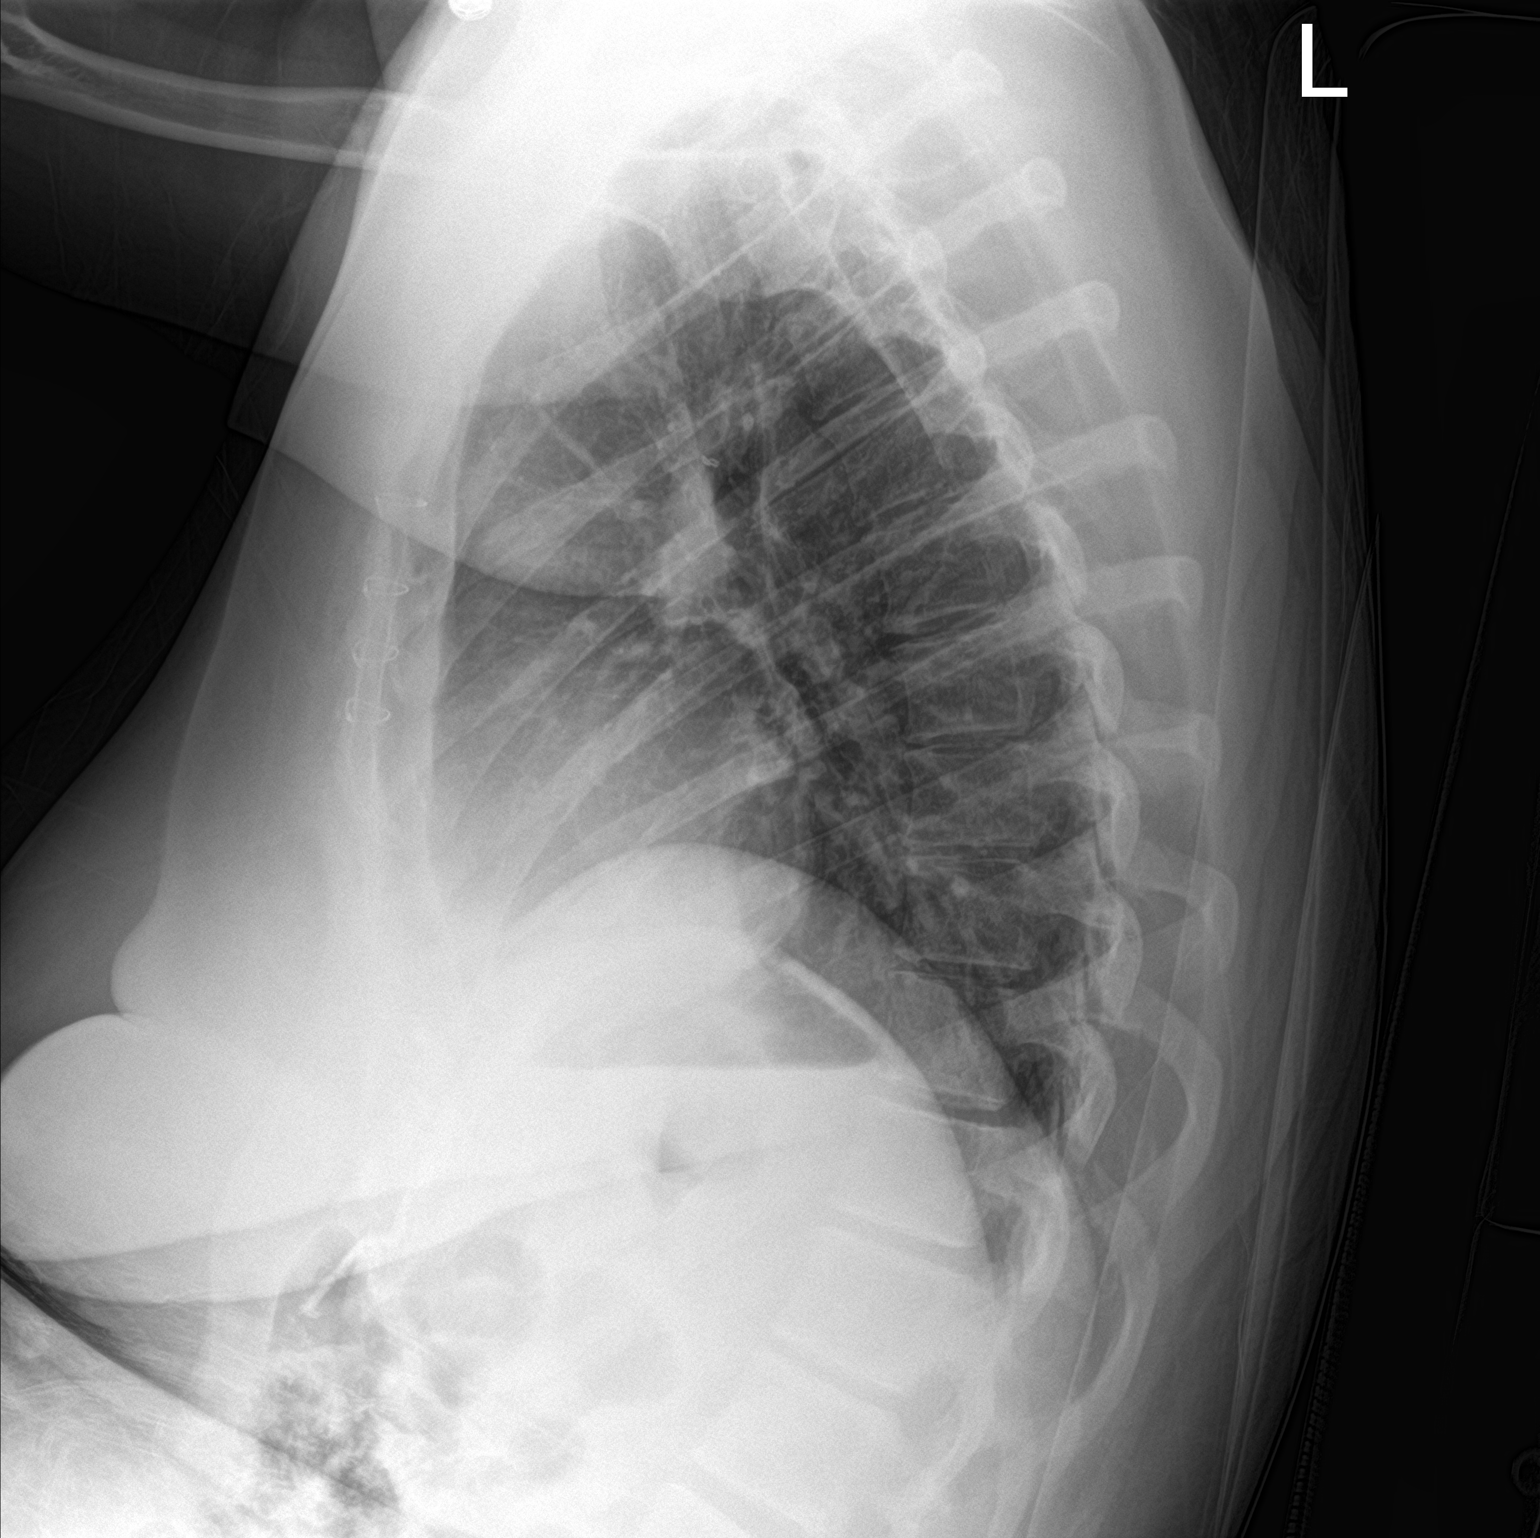

[2 of 2 positions shown; findings below may reference images not displayed]

FINDINGS: Stable postsurgical changes.

Cardiomediastinal silhouette is normal. Mediastinal contours appear
intact.

There is no evidence of focal airspace consolidation, pleural
effusion or pneumothorax.

Osseous structures are without acute abnormality. Soft tissues are
grossly normal.
IMPRESSION: No active cardiopulmonary disease.

## 2019-12-02 ENCOUNTER — Ambulatory Visit
Admission: EM | Admit: 2019-12-02 | Discharge: 2019-12-02 | Disposition: A | Discharge status: Home / Self Care | Payer: 59 | Attending: Emergency Medicine | Admitting: Emergency Medicine

## 2019-12-02 ENCOUNTER — Other Ambulatory Visit: Payer: Self-pay

## 2019-12-02 ENCOUNTER — Ambulatory Visit
Admission: RE | Admit: 2019-12-02 | Discharge: 2019-12-02 | Discharge status: Absent without leave | Payer: Self-pay | Source: Ambulatory Visit

## 2019-12-02 DIAGNOSIS — R109 Unspecified abdominal pain: Secondary | ICD-10-CM

## 2019-12-02 DIAGNOSIS — R300 Dysuria: Secondary | ICD-10-CM | POA: Insufficient documentation

## 2019-12-02 LAB — POCT URINALYSIS DIP (MANUAL ENTRY)
Bilirubin, UA: NEGATIVE
Blood, UA: NEGATIVE
Glucose, UA: NEGATIVE mg/dL
Ketones, POC UA: NEGATIVE mg/dL
Leukocytes, UA: NEGATIVE
Nitrite, UA: NEGATIVE
Protein Ur, POC: NEGATIVE mg/dL
Spec Grav, UA: 1.02 (ref 1.010–1.025)
Urobilinogen, UA: 0.2 E.U./dL
pH, UA: 7 (ref 5.0–8.0)

## 2019-12-02 MED ORDER — PHENAZOPYRIDINE HCL 200 MG PO TABS: 200.0000 mg | ORAL_TABLET | Freq: Three times a day (TID) | ORAL | 0 refills | Status: AC

## 2019-12-02 NOTE — ED Provider Notes (Signed)
[facility]   [id number] 12/02/19 Arrival Time: 1152  CC: ABDOMINAL DISCOMFORT  SUBJECTIVE: HPI obtained from mother Penny Thompson is a 24 y.o. female who presents with complaint of discomfort with urination and "not feeling well" x 4 days.  Denies a precipitating event, or trauma.  Mother does state she has trouble with bathroom hygiene and has a history of UTI in the past.  Localizes pain to the lower abdomen.  Denies alleviating factors.    Denies fever, chills, appetite changes, weight changes, nausea, vomiting, chest pain, SOB, diarrhea, constipation, difficulty urinating, increased frequency or urgency, flank pain, loss of bowel or bladder function, vaginal discharge, vaginal odor, vaginal bleeding, pelvic pain.     Patient's last menstrual period was 11/04/2019.  ROS: As per HPI.  All other pertinent ROS negative.     Past Medical History:  Diagnosis Date  . Chromosomal abnormality   . Epilepsy (HCC)   . Mental developmental delay    History reviewed. No pertinent surgical history. No Known Allergies No current facility-administered medications on file prior to encounter.   Current Outpatient Medications on File Prior to Encounter  Medication Sig Dispense Refill  . carbamazepine (CARBATROL) 300 MG 12 hr capsule Take 600 mg by mouth 2 (two) times daily.    . cetirizine (ZYRTEC) 10 MG tablet Take 1 tablet (10 mg total) by mouth daily. (Patient not taking: Reported on 03/04/2018) 30 tablet 11  . [DISCONTINUED] fluticasone (FLONASE) 50 MCG/ACT nasal spray Place 2 sprays into both nostrils 2 (two) times daily. (Patient not taking: Reported on 03/04/2018) 16 g 2   Social History   Socioeconomic History  . Marital status: Single    Spouse name: Not on file  . Number of children: Not on file  . Years of education: Not on file  . Highest education level: Not on file  Occupational History  . Not on file  Tobacco Use  . Smoking status: Never Smoker  . Smokeless  tobacco: Never Used  Substance and Sexual Activity  . Alcohol use: No  . Drug use: No  . Sexual activity: Never  Other Topics Concern  . Not on file  Social History Narrative  . Not on file   Social Determinants of Health   Financial Resource Strain:   . Difficulty of Paying Living Expenses: Not on file  Food Insecurity:   . Worried About Programme researcher, broadcasting/film/video in the Last Year: Not on file  . Ran Out of Food in the Last Year: Not on file  Transportation Needs:   . Lack of Transportation (Medical): Not on file  . Lack of Transportation (Non-Medical): Not on file  Physical Activity:   . Days of Exercise per Week: Not on file  . Minutes of Exercise per Session: Not on file  Stress:   . Feeling of Stress : Not on file  Social Connections:   . Frequency of Communication with Friends and Family: Not on file  . Frequency of Social Gatherings with Friends and Family: Not on file  . Attends Religious Services: Not on file  . Active Member of Clubs or Organizations: Not on file  . Attends Banker Meetings: Not on file  . Marital Status: Not on file  Intimate Partner Violence:   . Fear of Current or Ex-Partner: Not on file  . Emotionally Abused: Not on file  . Physically Abused: Not on file  . Sexually Abused: Not on file   Family History  Problem  Relation Age of Onset  . Diabetes Maternal Grandmother   . Diabetes Maternal Grandfather   . Diabetes Paternal Grandmother      OBJECTIVE:  Vitals:   12/02/19 1210  BP: 110/71  Pulse: 88  Resp: 20  Temp: 98.4 F (36.9 C)  SpO2: 98%    General appearance: Alert; NAD HEENT: NCAT.   Lungs: clear to auscultation bilaterally without adventitious breath sounds Heart: regular rate and rhythm.   Abdomen: (limited due to patient sitting up in exam chair) soft, non-distended; normal active bowel sounds; mildly TTP over RUQ; nontender at McBurney's point; no guarding Back: no CVA tenderness Extremities: no edema;  symmetrical with no gross deformities Skin: warm and dry Neurologic: normal gait Psychological: alert and cooperative; mildly anxious mood and affect  LABS: Results for orders placed or performed during the hospital encounter of 12/02/19 (from the past 24 hour(s))  POCT urinalysis dipstick     Status: None   Collection Time: 12/02/19 12:14 PM  Result Value Ref Range   Color, UA yellow yellow   Clarity, UA clear clear   Glucose, UA negative negative mg/dL   Bilirubin, UA negative negative   Ketones, POC UA negative negative mg/dL   Spec Grav, UA 8.563 1.497 - 1.025   Blood, UA negative negative   pH, UA 7.0 5.0 - 8.0   Protein Ur, POC negative negative mg/dL   Urobilinogen, UA 0.2 0.2 or 1.0 E.U./dL   Nitrite, UA Negative Negative   Leukocytes, UA Negative Negative     ASSESSMENT & PLAN:  1. Dysuria   2. Abdominal discomfort     Meds ordered this encounter  Medications  . phenazopyridine (PYRIDIUM) 200 MG tablet    Sig: Take 1 tablet (200 mg total) by mouth 3 (three) times daily.    Dispense:  6 tablet    Refill:  0    Order Specific Question:   Supervising Provider    Answer:   Eustace Moore [[id number]]   Unable to rule out gallbladder disease, or pancreatitis in urgent care setting.  Offered patient's mother further evaluation and management in the ED.  Patient's mother declines at this time and would like to try outpatient therapy first.  Aware of the risk associated with this decision including missed diagnosis, organ damage, organ failure, and/or death.  Patient aware and in agreement.     Declines vaginal swab today Urine without signs of infection Culture sent.  We will call you with abnormal results Push fluids and get rest Pyridium prescribed for discomfort with urination Follow up with PCP for recheck If you experience new or worsening symptoms return or go to ER such as fever, chills, nausea, vomiting, diarrhea, bloody or dark tarry stools, constipation,  urinary symptoms, worsening abdominal discomfort, symptoms that do not improve with medications, inability to keep fluids down, etc...  Reviewed expectations re: course of current medical issues. Questions answered. Outlined signs and symptoms indicating need for more acute intervention. Patient verbalized understanding. After Visit Summary given.   Rennis Harding, PA-C 12/02/19 1248

## 2019-12-02 NOTE — Discharge Instructions (Signed)
Declines vaginal swab today Urine without signs of infection Culture sent.  We will call you with abnormal results Push fluids and get rest Pyridium prescribed for discomfort with urination Follow up with PCP for recheck If you experience new or worsening symptoms return or go to ER such as fever, chills, nausea, vomiting, diarrhea, bloody or dark tarry stools, constipation, urinary symptoms, worsening abdominal discomfort, symptoms that do not improve with medications, inability to keep fluids down, etc..Marland Kitchen

## 2019-12-03 LAB — URINE CULTURE

## 2022-01-19 DIAGNOSIS — Z23 Encounter for immunization: Secondary | ICD-10-CM | POA: Diagnosis not present

## 2022-02-17 DIAGNOSIS — Z0001 Encounter for general adult medical examination with abnormal findings: Secondary | ICD-10-CM | POA: Diagnosis not present

## 2022-02-17 DIAGNOSIS — Z6838 Body mass index (BMI) 38.0-38.9, adult: Secondary | ICD-10-CM | POA: Diagnosis not present

## 2022-02-23 DIAGNOSIS — G40909 Epilepsy, unspecified, not intractable, without status epilepticus: Secondary | ICD-10-CM | POA: Diagnosis not present

## 2022-02-23 DIAGNOSIS — R69 Illness, unspecified: Secondary | ICD-10-CM | POA: Diagnosis not present

## 2022-02-23 DIAGNOSIS — Z0001 Encounter for general adult medical examination with abnormal findings: Secondary | ICD-10-CM | POA: Diagnosis not present

## 2022-02-23 DIAGNOSIS — E782 Mixed hyperlipidemia: Secondary | ICD-10-CM | POA: Diagnosis not present

## 2022-11-28 ENCOUNTER — Emergency Department (HOSPITAL_COMMUNITY)
Admission: EM | Admit: 2022-11-28 | Discharge: 2022-11-28 | Disposition: A | Discharge status: Home / Self Care | Payer: 59 | Attending: Emergency Medicine | Admitting: Emergency Medicine

## 2022-11-28 ENCOUNTER — Other Ambulatory Visit: Payer: Self-pay

## 2022-11-28 ENCOUNTER — Encounter (HOSPITAL_COMMUNITY): Payer: Self-pay | Admitting: *Deleted

## 2022-11-28 DIAGNOSIS — Y92002 Bathroom of unspecified non-institutional (private) residence single-family (private) house as the place of occurrence of the external cause: Secondary | ICD-10-CM | POA: Diagnosis not present

## 2022-11-28 DIAGNOSIS — Y9301 Activity, walking, marching and hiking: Secondary | ICD-10-CM | POA: Insufficient documentation

## 2022-11-28 DIAGNOSIS — W19XXXA Unspecified fall, initial encounter: Secondary | ICD-10-CM

## 2022-11-28 DIAGNOSIS — J029 Acute pharyngitis, unspecified: Secondary | ICD-10-CM | POA: Insufficient documentation

## 2022-11-28 DIAGNOSIS — S0993XA Unspecified injury of face, initial encounter: Secondary | ICD-10-CM | POA: Diagnosis present

## 2022-11-28 DIAGNOSIS — R112 Nausea with vomiting, unspecified: Secondary | ICD-10-CM | POA: Diagnosis not present

## 2022-11-28 DIAGNOSIS — R197 Diarrhea, unspecified: Secondary | ICD-10-CM | POA: Diagnosis not present

## 2022-11-28 DIAGNOSIS — W1839XA Other fall on same level, initial encounter: Secondary | ICD-10-CM | POA: Insufficient documentation

## 2022-11-28 LAB — URINALYSIS, ROUTINE W REFLEX MICROSCOPIC
Bilirubin Urine: NEGATIVE
Glucose, UA: NEGATIVE mg/dL
Ketones, ur: 20 mg/dL — AB
Leukocytes,Ua: NEGATIVE
Nitrite: NEGATIVE
Protein, ur: 100 mg/dL — AB
RBC / HPF: >50 RBC/hpf (ref 0–5)
Specific Gravity, Urine: 1.02 (ref 1.005–1.030)
pH: 6 (ref 5.0–8.0)

## 2022-11-28 NOTE — Discharge Instructions (Signed)
Penny Thompson was seen in the ER after a fall.  As we discussed, I do not feel she is requiring imaging of her head at this time.  I recommend giving the nausea medication her doctor prescribed as needed. She can have some peptobismol or immodium for diarrhea.  Make sure she is staying well hydrated. Return to the ER for new or worsening symptoms.

## 2022-11-28 NOTE — ED Triage Notes (Signed)
Pt fell last night before going to bed, this morning with emesis and diarrhea.  Pale per family member, + HA and chills. Mild confusion, family member states she hit her chin ?floor. Pt c/o abd pain, recent antibiotics for strep throat but continues to c/o sore throat.

## 2022-11-28 NOTE — ED Provider Notes (Signed)
[place] EMERGENCY DEPARTMENT AT [facility] Provider Note   CSN: [id number] Arrival date & time: 11/28/22  1145     History  Chief Complaint  Patient presents with   Penny Thompson is a 27 y.o. female with history of atrial septal defect, partial epilepsy, chromosomal abnormality, who presents to the ER after a fall. Patient presents with parents. They report patient fell while walking to the bathroom last night, was able to get herself up and did not lose consciousness.  She did sustain a bruise on her chin.  She started having vomiting and diarrhea this morning.  Her parents state that when she is looking more pale and having some vomiting, this oftentimes precedes a seizure.  At this time headache has resolved, parents say that she is acting more like herself.  She did just finish a course of Augmentin for strep throat. She still has a sore throat, but her mother thinks it is likely due to the recent weather change.    Fall Associated symptoms include abdominal pain and headaches.       Home Medications Prior to Admission medications   Medication Sig Start Date End Date Taking? Authorizing Provider  carbamazepine (CARBATROL) 300 MG 12 hr capsule Take 600 mg by mouth 2 (two) times daily. 09/12/19   [provider]  cetirizine (ZYRTEC) 10 MG tablet Take 1 tablet (10 mg total) by mouth daily. Patient not taking: Reported on 03/04/2018 04/05/15   McDonell, Alfredia Client, MD  phenazopyridine (PYRIDIUM) 200 MG tablet Take 1 tablet (200 mg total) by mouth 3 (three) times daily. 12/02/19   Wurst, [place], PA-C  fluticasone (FLONASE) 50 MCG/ACT nasal spray Place 2 sprays into both nostrils 2 (two) times daily. Patient not taking: Reported on 03/04/2018 02/22/15 12/02/19  McDonell, Alfredia Client, MD      Allergies    Patient has no known allergies.    Review of Systems   Review of Systems  HENT:  Positive for sore throat.   Gastrointestinal:  Positive for abdominal  pain, diarrhea, nausea and vomiting.  Neurological:  Positive for headaches.  All other systems reviewed and are negative.   Physical Exam Updated Vital Signs BP 135/78   Pulse 90   Temp 98.5 F (36.9 C)   Resp 18   Ht 5' 7.5" (1.715 m)   Wt 117.9 kg   LMP 11/07/2022 (Approximate)   SpO2 100%   BMI 40.12 kg/m  Physical Exam Vitals and nursing note reviewed.  Constitutional:      Appearance: Normal appearance.  HENT:     Head: Normocephalic.     Comments: Bruise noted under the chin    Mouth/Throat:     Pharynx: Oropharynx is clear. Uvula midline. Posterior oropharyngeal erythema present. No oropharyngeal exudate.     Tonsils: No tonsillar exudate or tonsillar abscesses.  Eyes:     General: Lids are normal.     Extraocular Movements: Extraocular movements intact.     Conjunctiva/sclera: Conjunctivae normal.     Pupils: Pupils are equal, round, and reactive to light.  Neck:     Meningeal: Brudzinski's sign and Kernig's sign absent.  Cardiovascular:     Rate and Rhythm: Normal rate and regular rhythm.  Pulmonary:     Effort: Pulmonary effort is normal. No respiratory distress.     Breath sounds: Normal breath sounds.  Abdominal:     General: There is no distension.     Palpations: Abdomen is soft.  Tenderness: There is abdominal tenderness in the left lower quadrant.  Musculoskeletal:     Cervical back: Full passive range of motion without pain. No rigidity.  Skin:    General: Skin is warm and dry.  Neurological:     General: No focal deficit present.     Mental Status: She is alert. Mental status is at baseline.     Comments: Neuro: Speech is clear, able to follow commands. CN III-XII intact grossly intact. PERRLA. EOMI. Sensation intact throughout. Str 5/5 all extremities.     ED Results / Procedures / Treatments   Labs (all labs ordered are listed, but only abnormal results are displayed) Labs Reviewed  URINALYSIS, ROUTINE W REFLEX MICROSCOPIC - Abnormal;  Notable for the following components:      Result Value   APPearance HAZY (*)    Hgb urine dipstick LARGE (*)    Ketones, ur 20 (*)    Protein, ur 100 (*)    Bacteria, UA RARE (*)    All other components within normal limits    EKG None  Radiology No results found.  Procedures Procedures    Medications Ordered in ED Medications - No data to display  ED Course/ Medical Decision Making/ A&P                                 Medical Decision Making Amount and/or Complexity of Data Reviewed Labs: ordered.   This patient is a 27 y.o. female  who presents to the ED for concern of fall, vomiting, and diarrhea.  Differential diagnoses prior to evaluation: The emergent differential diagnosis includes, but is not limited to,  TBI, viral illness, c diff, enteritis. This is not an exhaustive differential.   Past Medical History / Co-morbidities / Social History:  atrial septal defect, partial epilepsy, chromosomal abnormality  Additional history: Chart reviewed. Pertinent results include: cannot review telemedicine encounter this AM  Physical Exam: Physical exam performed. The pertinent findings include: Normal vital signs, no acute distress.  Bruise noted under the chin and some pharyngeal erythema, but otherwise HEENT exam normal as above.  Baseline mental status and normal neurologic exam.  Lab Tests/Imaging studies: I personally interpreted labs/imaging and the pertinent results include: Urinalysis with large hemoglobin, negative for infection.  I discussed possibility of obtaining a head CT on this patient.  I have low concern for severe intracranial or cervical spine pathology per Nexus criteria.  Will forego imaging, and patient's parents are agreeable to this.  Disposition: After consideration of the diagnostic results and the patients response to treatment, I feel that emergency department workup does not suggest an emergent condition requiring admission or immediate  intervention beyond what has been performed at this time. The plan is: Discharge to home.  Overall patient is clinically well-appearing.  Her parents say that she is acting a lot more like herself during her time in the ER.  They were initially worried as some of her symptoms tend to precede a seizure, but she has had no seizure-like activity while in the ER after her almost 6-hour period of observation.  Her mom had given her a stool softener after she had already had an episode of diarrhea.  So I suspect her ongoing diarrhea is more so from that.  I have low concern for bacterial infection such as C. difficile, as patient does not clinically appear dehydrated, afebrile.  Suspect ongoing sore throat after strep to  seasonal allergies.  Patient already had prescription for Compazine sent by her primary care and I recommended they could have stool studies done if her symptoms persist.  Advised parents to monitor her closely and return to the ER for any signs of dehydration or seizures.  The patient is safe for discharge and has been instructed to return immediately for worsening symptoms, change in symptoms or any other concerns. Patient and parents agreeable to the plan.   Final Clinical Impression(s) / ED Diagnoses Final diagnoses:  Fall, initial encounter  Chin injury, initial encounter  Nausea vomiting and diarrhea    Rx / DC Orders ED Discharge Orders     None      Portions of this report may have been transcribed using voice recognition software. Every effort was made to ensure accuracy; however, inadvertent computerized transcription errors may be present.    Jeanella Flattery 11/28/22 1857    Gloris Manchester, MD 11/28/22 [phone]

## 2023-12-28 ENCOUNTER — Encounter: Payer: Self-pay | Admitting: Podiatry

## 2023-12-28 ENCOUNTER — Ambulatory Visit: Admitting: Podiatry

## 2023-12-28 VITALS — Ht 67.5 in | Wt 260.0 lb

## 2023-12-28 DIAGNOSIS — B351 Tinea unguium: Secondary | ICD-10-CM | POA: Diagnosis not present

## 2023-12-28 DIAGNOSIS — M79675 Pain in left toe(s): Secondary | ICD-10-CM | POA: Diagnosis not present

## 2023-12-28 MED ORDER — CLOTRIMAZOLE-BETAMETHASONE 1-0.05 % EX CREA: 1.0000 | TOPICAL_CREAM | Freq: Every day | CUTANEOUS | 2 refills | Status: AC

## 2023-12-28 MED ORDER — TERBINAFINE HCL 250 MG PO TABS: 250.0000 mg | ORAL_TABLET | Freq: Every day | ORAL | 0 refills | Status: DC

## 2023-12-28 NOTE — Progress Notes (Signed)
   Chief Complaint  Patient presents with   Nail Problem    Pt is here due to bilateral toenail fungus , mom states that she has a rash on the foot, seen her PCP for the issue and was until the toenail fungus infection is treated the rash on her foot while not go away.    Subjective: 28 y.o. female presenting today with her mother for evaluation of discoloration and thickening to the left toenails.  Also history of skin rash to the dorsum of the left foot.  They have been applying ketoconazole with minimal improvement.  They have also tried different topical antifungal medications for the toenails without improvement  Past Medical History:  Diagnosis Date   Chromosomal abnormality    Epilepsy (HCC)    Mental developmental delay     No past surgical history on file.  No Known Allergies   LT foot 12/28/2023  Objective: Physical Exam General: The patient is alert and oriented x3 in no acute distress.  Dermatology: Hyperkeratotic, discolored, thickened, onychodystrophy noted. Skin is warm, dry and supple bilateral lower extremities.   Vascular: Palpable pedal pulses bilaterally. No edema or erythema noted. Capillary refill within normal limits.  Neurological: Grossly intact via light touch  Musculoskeletal Exam: No pedal deformity noted  Assessment: #1 Onychomycosis of toenails left foot #2 Dermatitis left foot   Plan of Care:  -Patient was evaluated. -Today we discussed different treatment options including oral, topical, and laser antifungal treatment modalities.  We discussed their efficacies and side effects.  Patient opts for oral antifungal treatment modality -Prescription for Lamisil 250 mg #90 daily.  No history of liver pathology or symptoms.   -Prescription for Lotrisone cream to apply to the skin lesion twice daily -Return to clinic PRN   Thresa EMERSON Sar, DPM [facility]  Dr. Thresa EMERSON Sar, DPM    [address] [place], [place] [address]                Office [phone]  Fax [phone]

## 2024-03-17 ENCOUNTER — Other Ambulatory Visit: Payer: Self-pay | Admitting: Podiatry

## 2024-04-01 ENCOUNTER — Other Ambulatory Visit: Payer: Self-pay | Admitting: Podiatry

## 2024-04-01 DIAGNOSIS — Z79899 Other long term (current) drug therapy: Secondary | ICD-10-CM

## 2024-04-02 ENCOUNTER — Other Ambulatory Visit: Payer: Self-pay | Admitting: Lab

## 2024-04-02 MED ORDER — TERBINAFINE HCL 250 MG PO TABS: 250.0000 mg | ORAL_TABLET | Freq: Every day | ORAL | 0 refills | Status: AC
